# Patient Record
Sex: Female | Born: 1995
Health system: Southern US, Community
[De-identification: ages and names within clinical notes are randomized; demographics above are authoritative.]

## PROBLEM LIST (undated history)

## (undated) DIAGNOSIS — B1920 Unspecified viral hepatitis C without hepatic coma: Secondary | ICD-10-CM

## (undated) DIAGNOSIS — F32A Depression, unspecified: Secondary | ICD-10-CM

## (undated) DIAGNOSIS — R519 Headache, unspecified: Secondary | ICD-10-CM

## (undated) DIAGNOSIS — F329 Major depressive disorder, single episode, unspecified: Secondary | ICD-10-CM

## (undated) DIAGNOSIS — R51 Headache: Secondary | ICD-10-CM

---
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
## 2017-07-08 DIAGNOSIS — X58XXXA Exposure to other specified factors, initial encounter: Secondary | ICD-10-CM | POA: Diagnosis not present

## 2017-07-08 DIAGNOSIS — R1030 Lower abdominal pain, unspecified: Secondary | ICD-10-CM | POA: Diagnosis not present

## 2017-07-08 DIAGNOSIS — S0993XA Unspecified injury of face, initial encounter: Secondary | ICD-10-CM | POA: Diagnosis not present

## 2017-07-08 DIAGNOSIS — N72 Inflammatory disease of cervix uteri: Secondary | ICD-10-CM | POA: Diagnosis not present

## 2017-07-08 DIAGNOSIS — B373 Candidiasis of vulva and vagina: Secondary | ICD-10-CM | POA: Diagnosis not present

## 2017-07-08 DIAGNOSIS — N941 Unspecified dyspareunia: Secondary | ICD-10-CM | POA: Diagnosis not present

## 2017-07-08 DIAGNOSIS — S0590XA Unspecified injury of unspecified eye and orbit, initial encounter: Secondary | ICD-10-CM | POA: Diagnosis not present

## 2017-08-04 DIAGNOSIS — J039 Acute tonsillitis, unspecified: Secondary | ICD-10-CM | POA: Diagnosis not present

## 2017-08-04 DIAGNOSIS — B370 Candidal stomatitis: Secondary | ICD-10-CM | POA: Diagnosis not present

## 2017-08-06 DIAGNOSIS — Z5329 Procedure and treatment not carried out because of patient's decision for other reasons: Secondary | ICD-10-CM | POA: Diagnosis not present

## 2017-08-06 DIAGNOSIS — R21 Rash and other nonspecific skin eruption: Secondary | ICD-10-CM | POA: Diagnosis not present

## 2017-08-20 DIAGNOSIS — R070 Pain in throat: Secondary | ICD-10-CM | POA: Diagnosis not present

## 2017-08-20 DIAGNOSIS — J029 Acute pharyngitis, unspecified: Secondary | ICD-10-CM | POA: Diagnosis not present

## 2017-09-24 DIAGNOSIS — N939 Abnormal uterine and vaginal bleeding, unspecified: Secondary | ICD-10-CM | POA: Diagnosis not present

## 2017-09-24 DIAGNOSIS — R1030 Lower abdominal pain, unspecified: Secondary | ICD-10-CM | POA: Diagnosis not present

## 2017-09-24 DIAGNOSIS — L03011 Cellulitis of right finger: Secondary | ICD-10-CM | POA: Diagnosis not present

## 2017-10-06 DIAGNOSIS — F331 Major depressive disorder, recurrent, moderate: Secondary | ICD-10-CM | POA: Diagnosis not present

## 2017-10-06 DIAGNOSIS — F419 Anxiety disorder, unspecified: Secondary | ICD-10-CM | POA: Diagnosis not present

## 2017-11-17 ENCOUNTER — Encounter (HOSPITAL_COMMUNITY): Payer: Self-pay | Admitting: *Deleted

## 2017-11-17 ENCOUNTER — Emergency Department (HOSPITAL_COMMUNITY)
Admission: EM | Admit: 2017-11-17 | Discharge: 2017-11-17 | Disposition: A | Discharge status: Home / Self Care | Payer: Federal, State, Local not specified - PPO | Attending: Emergency Medicine | Admitting: Emergency Medicine

## 2017-11-17 ENCOUNTER — Emergency Department (HOSPITAL_COMMUNITY): Payer: Federal, State, Local not specified - PPO

## 2017-11-17 ENCOUNTER — Other Ambulatory Visit: Payer: Self-pay

## 2017-11-17 DIAGNOSIS — Y92410 Unspecified street and highway as the place of occurrence of the external cause: Secondary | ICD-10-CM | POA: Diagnosis not present

## 2017-11-17 DIAGNOSIS — S0993XA Unspecified injury of face, initial encounter: Secondary | ICD-10-CM | POA: Insufficient documentation

## 2017-11-17 DIAGNOSIS — S299XXA Unspecified injury of thorax, initial encounter: Secondary | ICD-10-CM | POA: Diagnosis not present

## 2017-11-17 DIAGNOSIS — Y9389 Activity, other specified: Secondary | ICD-10-CM | POA: Insufficient documentation

## 2017-11-17 DIAGNOSIS — S031XXA Dislocation of septal cartilage of nose, initial encounter: Secondary | ICD-10-CM | POA: Diagnosis not present

## 2017-11-17 DIAGNOSIS — R4182 Altered mental status, unspecified: Secondary | ICD-10-CM | POA: Diagnosis not present

## 2017-11-17 DIAGNOSIS — Z23 Encounter for immunization: Secondary | ICD-10-CM | POA: Insufficient documentation

## 2017-11-17 DIAGNOSIS — Y998 Other external cause status: Secondary | ICD-10-CM | POA: Insufficient documentation

## 2017-11-17 DIAGNOSIS — Z91040 Latex allergy status: Secondary | ICD-10-CM | POA: Diagnosis not present

## 2017-11-17 DIAGNOSIS — S199XXA Unspecified injury of neck, initial encounter: Secondary | ICD-10-CM | POA: Diagnosis not present

## 2017-11-17 DIAGNOSIS — Z79899 Other long term (current) drug therapy: Secondary | ICD-10-CM | POA: Insufficient documentation

## 2017-11-17 DIAGNOSIS — F17200 Nicotine dependence, unspecified, uncomplicated: Secondary | ICD-10-CM | POA: Diagnosis not present

## 2017-11-17 DIAGNOSIS — S0990XA Unspecified injury of head, initial encounter: Secondary | ICD-10-CM | POA: Diagnosis not present

## 2017-11-17 DIAGNOSIS — R413 Other amnesia: Secondary | ICD-10-CM | POA: Diagnosis not present

## 2017-11-17 DIAGNOSIS — S022XXA Fracture of nasal bones, initial encounter for closed fracture: Secondary | ICD-10-CM | POA: Diagnosis not present

## 2017-11-17 HISTORY — DX: Unspecified viral hepatitis C without hepatic coma: B19.20

## 2017-11-17 LAB — CBC WITH DIFFERENTIAL/PLATELET
Abs Immature Granulocytes: 0 10*3/uL (ref 0.0–0.1)
Basophils Absolute: 0 10*3/uL (ref 0.0–0.1)
Basophils Relative: 0 %
EOS PCT: 2 %
Eosinophils Absolute: 0.1 10*3/uL (ref 0.0–0.7)
HEMATOCRIT: 36.3 % (ref 36.0–46.0)
HEMOGLOBIN: 11.6 g/dL — AB (ref 12.0–15.0)
IMMATURE GRANULOCYTES: 0 %
LYMPHS ABS: 1.8 10*3/uL (ref 0.7–4.0)
LYMPHS PCT: 25 %
MCH: 29.3 pg (ref 26.0–34.0)
MCHC: 32 g/dL (ref 30.0–36.0)
MCV: 91.7 fL (ref 78.0–100.0)
MONOS PCT: 9 %
Monocytes Absolute: 0.7 10*3/uL (ref 0.1–1.0)
Neutro Abs: 4.6 10*3/uL (ref 1.7–7.7)
Neutrophils Relative %: 64 %
Platelets: 275 10*3/uL (ref 150–400)
RBC: 3.96 MIL/uL (ref 3.87–5.11)
RDW: 11.9 % (ref 11.5–15.5)
WBC: 7.3 10*3/uL (ref 4.0–10.5)

## 2017-11-17 LAB — URINALYSIS, ROUTINE W REFLEX MICROSCOPIC
BILIRUBIN URINE: NEGATIVE
GLUCOSE, UA: NEGATIVE mg/dL
KETONES UR: NEGATIVE mg/dL
LEUKOCYTES UA: NEGATIVE
NITRITE: NEGATIVE
PH: 9 — AB (ref 5.0–8.0)
Protein, ur: NEGATIVE mg/dL
RBC / HPF: >50 RBC/hpf — ABNORMAL HIGH (ref 0–5)
SPECIFIC GRAVITY, URINE: 1.009 (ref 1.005–1.030)

## 2017-11-17 LAB — COMPREHENSIVE METABOLIC PANEL
ALK PHOS: 79 U/L (ref 38–126)
ALT: 30 U/L (ref 0–44)
ANION GAP: 8 (ref 5–15)
AST: 45 U/L — ABNORMAL HIGH (ref 15–41)
Albumin: 3.8 g/dL (ref 3.5–5.0)
BILIRUBIN TOTAL: 0.7 mg/dL (ref 0.3–1.2)
BUN: 10 mg/dL (ref 6–20)
CALCIUM: 8.8 mg/dL — AB (ref 8.9–10.3)
CO2: 25 mmol/L (ref 22–32)
Chloride: 104 mmol/L (ref 98–111)
Creatinine, Ser: 0.7 mg/dL (ref 0.44–1.00)
Glucose, Bld: 94 mg/dL (ref 70–99)
POTASSIUM: 3.4 mmol/L — AB (ref 3.5–5.1)
Sodium: 137 mmol/L (ref 135–145)
TOTAL PROTEIN: 6.6 g/dL (ref 6.5–8.1)

## 2017-11-17 LAB — HCG, QUANTITATIVE, PREGNANCY

## 2017-11-17 LAB — RAPID URINE DRUG SCREEN, HOSP PERFORMED
Amphetamines: POSITIVE — AB
BENZODIAZEPINES: POSITIVE — AB
Barbiturates: NOT DETECTED
Cocaine: NOT DETECTED
OPIATES: NOT DETECTED
Tetrahydrocannabinol: NOT DETECTED

## 2017-11-17 LAB — I-STAT CG4 LACTIC ACID, ED: Lactic Acid, Venous: 0.98 mmol/L (ref 0.5–1.9)

## 2017-11-17 MED ORDER — TETANUS-DIPHTH-ACELL PERTUSSIS 5-2.5-18.5 LF-MCG/0.5 IM SUSP
0.5000 mL | Freq: Once | INTRAMUSCULAR | Status: AC
Start: 2017-11-17 — End: 2017-11-17
  Administered 2017-11-17: 0.5 mL via INTRAMUSCULAR
  Filled 2017-11-17: qty 0.5

## 2017-11-17 MED ORDER — NALOXONE HCL 0.4 MG/ML IJ SOLN
0.4000 mg | Freq: Once | INTRAMUSCULAR | Status: AC
  Administered 2017-11-17: 0.4 mg via INTRAVENOUS
  Filled 2017-11-17: qty 1

## 2017-11-17 MED ORDER — NALOXONE HCL 0.4 MG/ML IJ SOLN
0.4000 mg | Freq: Once | INTRAMUSCULAR | Status: AC
Start: 2017-11-17 — End: 2017-11-17
  Administered 2017-11-17: 0.4 mg via INTRAVENOUS
  Filled 2017-11-17: qty 1

## 2017-11-17 MED ORDER — ONDANSETRON HCL 4 MG/2ML IJ SOLN
4.0000 mg | Freq: Once | INTRAMUSCULAR | Status: DC
  Filled 2017-11-17: qty 2

## 2017-11-17 MED ORDER — MORPHINE SULFATE 15 MG PO TABS
15.0000 mg | ORAL_TABLET | Freq: Once | ORAL | Status: AC
  Administered 2017-11-17: 15 mg via ORAL
  Filled 2017-11-17: qty 1

## 2017-11-17 MED ORDER — CEPHALEXIN 500 MG PO CAPS: 500.0000 mg | ORAL_CAPSULE | Freq: Four times a day (QID) | ORAL | 0 refills | Status: AC

## 2017-11-17 MED ORDER — OXYMETAZOLINE HCL 0.05 % NA SOLN: 1.0000 | Freq: Once | NASAL | Status: DC

## 2017-11-17 MED ORDER — CEFAZOLIN SODIUM-DEXTROSE 2-4 GM/100ML-% IV SOLN
2.0000 g | Freq: Once | INTRAVENOUS | Status: AC
  Administered 2017-11-17: 2 g via INTRAVENOUS
  Filled 2017-11-17: qty 100
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
## 2017-11-17 NOTE — ED Notes (Signed)
More alert after narcan states she took suboxone this am.

## 2017-11-17 NOTE — ED Notes (Signed)
Ice pack applied  To nose

## 2017-11-17 NOTE — ED Notes (Signed)
Patient transported to CT 
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
## 2017-11-17 NOTE — ED Notes (Signed)
Patient is very agitated  Wanting to leave and go get her stuff out of her car,  States she is going to leave ,mother is very upset , Cammy Copabigail is at bedside. Explaining to patient the importance of staying

## 2017-11-17 NOTE — Discharge Instructions (Signed)
Heather CollinBreyanna Gilreath:  Thank you for allowing us to take care of you today.  We hope you begin feeling better soon.  To-Do: Please follow-up with ENT in one week Take Keflex for your antibiotic Take tylenol and motrin for your pain Please return to the Emergency Department or call 911 if you experience chest pain, shortness of breath, severe pain, severe fever, altered mental status, or have any reason to think that you need emergency medical care.  Thank you again.  Hope you feel better soon.

## 2017-11-17 NOTE — ED Notes (Signed)
Patient is very sleepy , very hard to arouse, Harris PA at bedside. Narcan given.
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
## 2017-11-17 NOTE — ED Notes (Signed)
Mother at bedside. Patient is alert talking to mother ,

## 2017-11-23 DIAGNOSIS — S022XXD Fracture of nasal bones, subsequent encounter for fracture with routine healing: Secondary | ICD-10-CM | POA: Diagnosis not present

## 2017-11-23 DIAGNOSIS — T171XXD Foreign body in nostril, subsequent encounter: Secondary | ICD-10-CM | POA: Diagnosis not present

## 2017-11-26 DIAGNOSIS — X58XXXD Exposure to other specified factors, subsequent encounter: Secondary | ICD-10-CM | POA: Diagnosis not present

## 2017-11-26 DIAGNOSIS — J342 Deviated nasal septum: Secondary | ICD-10-CM | POA: Diagnosis not present

## 2017-11-26 DIAGNOSIS — S022XXA Fracture of nasal bones, initial encounter for closed fracture: Secondary | ICD-10-CM | POA: Diagnosis not present

## 2017-11-26 DIAGNOSIS — Y999 Unspecified external cause status: Secondary | ICD-10-CM | POA: Diagnosis not present

## 2017-11-26 DIAGNOSIS — S022XXD Fracture of nasal bones, subsequent encounter for fracture with routine healing: Secondary | ICD-10-CM | POA: Diagnosis not present

## 2017-11-26 DIAGNOSIS — S0035XA Superficial foreign body of nose, initial encounter: Secondary | ICD-10-CM | POA: Diagnosis not present

## 2017-12-06 HISTORY — PX: NOSE SURGERY: SHX723

## 2017-12-27 DIAGNOSIS — B9689 Other specified bacterial agents as the cause of diseases classified elsewhere: Secondary | ICD-10-CM | POA: Diagnosis not present

## 2017-12-27 DIAGNOSIS — L02415 Cutaneous abscess of right lower limb: Secondary | ICD-10-CM | POA: Diagnosis not present

## 2017-12-27 DIAGNOSIS — R52 Pain, unspecified: Secondary | ICD-10-CM | POA: Diagnosis not present

## 2018-04-20 DIAGNOSIS — R234 Changes in skin texture: Secondary | ICD-10-CM | POA: Diagnosis not present

## 2018-04-20 DIAGNOSIS — M79601 Pain in right arm: Secondary | ICD-10-CM | POA: Diagnosis not present

## 2018-04-20 DIAGNOSIS — F419 Anxiety disorder, unspecified: Secondary | ICD-10-CM | POA: Diagnosis not present

## 2018-05-13 DIAGNOSIS — N760 Acute vaginitis: Secondary | ICD-10-CM | POA: Diagnosis not present

## 2018-05-13 DIAGNOSIS — Z202 Contact with and (suspected) exposure to infections with a predominantly sexual mode of transmission: Secondary | ICD-10-CM | POA: Diagnosis not present

## 2018-05-13 DIAGNOSIS — Z760 Encounter for issue of repeat prescription: Secondary | ICD-10-CM | POA: Diagnosis not present

## 2018-05-17 DIAGNOSIS — R5382 Chronic fatigue, unspecified: Secondary | ICD-10-CM | POA: Diagnosis not present

## 2018-05-31 DIAGNOSIS — F419 Anxiety disorder, unspecified: Secondary | ICD-10-CM | POA: Diagnosis not present

## 2018-05-31 DIAGNOSIS — N760 Acute vaginitis: Secondary | ICD-10-CM | POA: Diagnosis not present

## 2018-05-31 DIAGNOSIS — F411 Generalized anxiety disorder: Secondary | ICD-10-CM | POA: Diagnosis not present

## 2018-05-31 DIAGNOSIS — N771 Vaginitis, vulvitis and vulvovaginitis in diseases classified elsewhere: Secondary | ICD-10-CM | POA: Diagnosis not present

## 2018-05-31 DIAGNOSIS — B182 Chronic viral hepatitis C: Secondary | ICD-10-CM | POA: Diagnosis not present

## 2018-07-09 ENCOUNTER — Encounter: Payer: Federal, State, Local not specified - PPO | Admitting: Infectious Diseases

## 2018-07-23 DIAGNOSIS — L03317 Cellulitis of buttock: Secondary | ICD-10-CM | POA: Diagnosis not present

## 2018-07-25 DIAGNOSIS — L0231 Cutaneous abscess of buttock: Secondary | ICD-10-CM | POA: Diagnosis not present

## 2018-07-25 DIAGNOSIS — F17210 Nicotine dependence, cigarettes, uncomplicated: Secondary | ICD-10-CM | POA: Diagnosis not present

## 2018-07-25 DIAGNOSIS — B9689 Other specified bacterial agents as the cause of diseases classified elsewhere: Secondary | ICD-10-CM | POA: Diagnosis not present

## 2018-08-24 DIAGNOSIS — L03115 Cellulitis of right lower limb: Secondary | ICD-10-CM | POA: Diagnosis not present

## 2018-08-24 DIAGNOSIS — Z716 Tobacco abuse counseling: Secondary | ICD-10-CM | POA: Diagnosis not present

## 2018-08-27 DIAGNOSIS — L0201 Cutaneous abscess of face: Secondary | ICD-10-CM | POA: Diagnosis not present

## 2018-08-27 DIAGNOSIS — L03211 Cellulitis of face: Secondary | ICD-10-CM | POA: Diagnosis not present

## 2018-08-27 DIAGNOSIS — B9689 Other specified bacterial agents as the cause of diseases classified elsewhere: Secondary | ICD-10-CM | POA: Diagnosis not present

## 2018-08-27 DIAGNOSIS — L02416 Cutaneous abscess of left lower limb: Secondary | ICD-10-CM | POA: Diagnosis not present

## 2018-08-27 DIAGNOSIS — Z8619 Personal history of other infectious and parasitic diseases: Secondary | ICD-10-CM | POA: Diagnosis not present

## 2018-08-27 DIAGNOSIS — F17210 Nicotine dependence, cigarettes, uncomplicated: Secondary | ICD-10-CM | POA: Diagnosis not present

## 2018-08-31 DIAGNOSIS — F1120 Opioid dependence, uncomplicated: Secondary | ICD-10-CM | POA: Diagnosis not present

## 2018-08-31 DIAGNOSIS — Z79899 Other long term (current) drug therapy: Secondary | ICD-10-CM | POA: Diagnosis not present

## 2018-08-31 DIAGNOSIS — F1010 Alcohol abuse, uncomplicated: Secondary | ICD-10-CM | POA: Diagnosis not present

## 2018-08-31 DIAGNOSIS — F1510 Other stimulant abuse, uncomplicated: Secondary | ICD-10-CM | POA: Diagnosis not present

## 2018-08-31 DIAGNOSIS — F332 Major depressive disorder, recurrent severe without psychotic features: Secondary | ICD-10-CM | POA: Diagnosis not present

## 2018-08-31 DIAGNOSIS — N3000 Acute cystitis without hematuria: Secondary | ICD-10-CM | POA: Diagnosis not present

## 2018-08-31 DIAGNOSIS — G2581 Restless legs syndrome: Secondary | ICD-10-CM | POA: Diagnosis not present

## 2018-08-31 DIAGNOSIS — F17210 Nicotine dependence, cigarettes, uncomplicated: Secondary | ICD-10-CM | POA: Diagnosis not present

## 2018-08-31 DIAGNOSIS — F1320 Sedative, hypnotic or anxiolytic dependence, uncomplicated: Secondary | ICD-10-CM | POA: Diagnosis not present

## 2018-08-31 DIAGNOSIS — F4310 Post-traumatic stress disorder, unspecified: Secondary | ICD-10-CM | POA: Diagnosis not present

## 2018-08-31 DIAGNOSIS — L0390 Cellulitis, unspecified: Secondary | ICD-10-CM | POA: Diagnosis not present

## 2018-09-01 DIAGNOSIS — S199XXA Unspecified injury of neck, initial encounter: Secondary | ICD-10-CM | POA: Diagnosis not present

## 2018-09-07 ENCOUNTER — Telehealth: Payer: Self-pay | Admitting: *Deleted

## 2018-09-07 ENCOUNTER — Other Ambulatory Visit: Payer: Self-pay

## 2018-09-07 ENCOUNTER — Encounter: Payer: Self-pay | Admitting: Infectious Diseases

## 2018-09-07 ENCOUNTER — Ambulatory Visit (INDEPENDENT_AMBULATORY_CARE_PROVIDER_SITE_OTHER): Payer: Federal, State, Local not specified - PPO | Admitting: Infectious Diseases

## 2018-09-07 VITALS — BP 103/66 | HR 80 | Temp 98.0°F | Ht 64.0 in | Wt 115.0 lb

## 2018-09-07 DIAGNOSIS — B182 Chronic viral hepatitis C: Secondary | ICD-10-CM | POA: Diagnosis not present

## 2018-09-07 DIAGNOSIS — R768 Other specified abnormal immunological findings in serum: Secondary | ICD-10-CM

## 2018-09-07 DIAGNOSIS — F1990 Other psychoactive substance use, unspecified, uncomplicated: Secondary | ICD-10-CM

## 2018-09-07 DIAGNOSIS — F1121 Opioid dependence, in remission: Secondary | ICD-10-CM

## 2018-09-07 DIAGNOSIS — F332 Major depressive disorder, recurrent severe without psychotic features: Secondary | ICD-10-CM

## 2018-09-07 MED ORDER — DOLACET PO
0.10 | ORAL | Status: DC
Start: ? — End: 2018-09-07

## 2018-09-07 MED ORDER — NUTREN 1.0 PO
1.00 | ORAL | Status: DC
Start: ? — End: 2018-09-07

## 2018-09-07 MED ORDER — PYRILAMINE TAN-PHENYLEPH TAN 30-5 MG/5ML PO SUSP
50.00 | ORAL | Status: DC
Start: ? — End: 2018-09-07

## 2018-09-07 MED ORDER — PYRIDOXINE-KELP-LECT-VINEGAR 20-33-400-80 MG PO TABS
ORAL_TABLET | ORAL | Status: DC
Start: 2018-09-05 — End: 2018-09-07

## 2018-09-07 MED ORDER — RA GLUCOSAMINE-CHONDROITIN DS 500-400 MG PO TABS
100.00 | ORAL_TABLET | ORAL | Status: DC
Start: 2018-09-05 — End: 2018-09-07

## 2018-09-07 MED ORDER — GENERIC EXTERNAL MEDICATION
1.00 | Status: DC
Start: 2018-09-06 — End: 2018-09-07

## 2018-09-07 MED ORDER — QUINERVA 260 MG PO TABS
650.00 | ORAL_TABLET | ORAL | Status: DC
Start: ? — End: 2018-09-07

## 2018-09-07 MED ORDER — SLO-NIACIN 500 MG PO TBCR
10.00 | EXTENDED_RELEASE_TABLET | ORAL | Status: DC
Start: ? — End: 2018-09-07

## 2018-09-07 MED ORDER — GENAPAP 325 MG PO TABS
2.00 | ORAL_TABLET | ORAL | Status: DC
Start: ? — End: 2018-09-07

## 2018-09-07 MED ORDER — MP TRI-FED COLD 2.5-60 MG PO TABS
50.00 | ORAL_TABLET | ORAL | Status: DC
Start: ? — End: 2018-09-07

## 2018-09-07 MED ORDER — PEDIASURE 1.0 CAL/FIBER PO LIQD
2.00 | ORAL | Status: DC
Start: ? — End: 2018-09-07

## 2018-09-07 MED ORDER — Medication
10.00 | Status: DC
Start: 2018-09-06 — End: 2018-09-07

## 2018-09-07 MED ORDER — SB BRONCHIAL 12.5-200 MG PO TABS
50.00 | ORAL_TABLET | ORAL | Status: DC
Start: ? — End: 2018-09-07

## 2018-09-07 MED ORDER — CVS CALCIUM CARBONATE PO
400.00 | ORAL | Status: DC
Start: ? — End: 2018-09-07

## 2018-09-07 MED ORDER — PIPERACILLIN SODIUM
10.00 | Status: DC
Start: ? — End: 2018-09-07

## 2018-09-07 MED ORDER — DIPHENDRYL 25 MG PO TABS
1.00 | ORAL_TABLET | ORAL | Status: DC
Start: 2018-09-06 — End: 2018-09-07

## 2018-09-07 MED ORDER — BENZOCAINE-MENTHOL 6-10 MG MT LOZG
1.00 | LOZENGE | OROMUCOSAL | Status: DC
Start: ? — End: 2018-09-07

## 2018-09-07 MED ORDER — PROMETHAZINE HCL (BULK CHEMICALS - P'S)
50.00 | Status: DC
Start: ? — End: 2018-09-07

## 2018-09-07 MED ORDER — HALOPERIDOL 5 MG PO TABS
5.00 | ORAL_TABLET | ORAL | Status: DC
Start: ? — End: 2018-09-07

## 2018-09-07 MED ORDER — IPOL IJ INJ
25.00 | INJECTION | INTRAMUSCULAR | Status: DC
Start: ? — End: 2018-09-07

## 2018-09-07 MED ORDER — POLY-VITAMIN/FLUORIDE/IRON 1-12 MG PO CHEW
1.00 | CHEWABLE_TABLET | ORAL | Status: DC
Start: 2018-09-05 — End: 2018-09-07

## 2018-09-07 MED ORDER — Medication
30.00 | Status: DC
Start: ? — End: 2018-09-07

## 2018-09-07 MED ORDER — Medication
5.00 | Status: DC
Start: ? — End: 2018-09-07

## 2018-09-07 MED ORDER — BL TUSSIN CF 30-10-100 MG/5ML PO SYRP
2.00 | ORAL_SOLUTION | ORAL | Status: DC
Start: ? — End: 2018-09-07

## 2018-09-07 MED ORDER — NEOMYCIN-POLYMYXIN-GRAMICIDIN 1.75-10000-.025 OP SOLN
8.00 | OPHTHALMIC | Status: DC
Start: ? — End: 2018-09-07

## 2018-09-07 MED ORDER — BELLADONNA ALKALOIDS-OPIUM RE
10.00 | RECTAL | Status: DC
Start: ? — End: 2018-09-07

## 2018-09-07 NOTE — Telephone Encounter (Signed)
Patient establishing care for treatment of Hepatitis C. She endorses some depression with anxiety, has recently finished drug treatment in [facility], is in weekly counseling with a pastor at her sister's church, has been to [facility] once. She would appreciate any counseling services available at [facility] as well, Will 'cc [facility]. Andree Coss, RN

## 2018-09-07 NOTE — Telephone Encounter (Signed)
Wonderful - thank you so much for helping refer her to [facility]. She is still at a vulnerable point in her rehab and treatment.

## 2018-09-07 NOTE — Progress Notes (Signed)
Patient Name: Heather Carney  Date of Birth: [birth year]  MRN: [id number]  PCP: Dema Severin, NP  Referring Provider: Dema Severin, NP, Ph#: [phone]   Patient Active Problem List   Diagnosis Date Noted  . Opioid use disorder, severe, in early remission, in controlled environment (HCC) 09/08/2018  . Injection of illicit drug within last 12 months 09/08/2018  . Positive hepatitis C antibody test 09/08/2018  . MDD (major depressive disorder), recurrent severe, without psychosis (HCC) 07/15/2014  . PTSD (post-traumatic stress disorder) 07/12/2014    CC:  New patient - initial evaluation and management of chronic hepatitis C infection.   HPI/ROS:  Heather Carney is a 23 y.o. female is a 23 y.o. female.   She is here today for consideration of treatment of chronic hepatitis C infection.  She first learned of this infection a few years back during a attempted detox.  Due to her mother's encouragement a few years back she was preparing to do treatment for hepatitis however she was not ready and did not decide to start medications at the time.  She recently completed a detox program and now in a supervised and supportive community setting and ready to accept treatment.  Hepatitis C risk factors include injection drug use x7 years with most recent use within the last 12 months.  She is not sure if she is ever been tested for HIV.  She is not currently sexually active but when she is using strict condom use each time.  Treated in January of this year for chlamydia per referral records.  She has a strong family history of drug use and mental health disorders.  She struggles with depression and anxiety and is currently taking treatment for this with gabapentin and Lexapro.  Reports moderate effect.  She denies suicide/homicidal intent or planning.  She smokes about a pack of cigarettes a day.  She does not drink any alcohol.  Major physical concerns coming in today include fatigue ("spends most  of the days asleep"), joint pain, multiple abscesses in various locations.  She reports having had all vaccinations as a child per recommendations.  Constitutional: negative for fevers, sweats, anorexia and weight loss; positive for fatigue/malaise Eyes: negative for icterus Cardiovascular: negative for dyspnea, lower extremity edema Gastrointestinal: negative for nausea, vomiting, constipation, abdominal pain and jaundice Hematologic/lymphatic: negative for easy bruising and petechiae Musculoskeletal: positive for arthralgias, negative for muscle weakness Neurological: negative for dizziness, paresthesia, gait problems and tremor Behavioral/Psych: positive for depression, anxiety; negative for current drug use, suicide intentions All other systems reviewed and are negative      Past Medical History:  Diagnosis Date  . Depression   . Headache   . Hepatitis C     Prior to Admission medications   Medication Sig Start Date End Date Taking? Authorizing Provider  acetaminophen (TYLENOL) 500 MG tablet Take 1,000 mg by mouth every 6 (six) hours as needed for mild pain.    [provider]  citalopram (CELEXA) 10 MG tablet Take 3 tablets (30 mg total) by mouth daily at 6 PM. Patient not taking: Reported on 11/17/2017 07/24/14   Withrow, Everardo All, FNP  ibuprofen (ADVIL,MOTRIN) 400 MG tablet Take 400 mg by mouth every 6 (six) hours as needed for headache or mild pain.    [provider]  Norgestimate-Ethinyl Estradiol Triphasic 0.18/0.215/0.25 MG-35 MCG tablet Take 1 tablet by mouth as directed. TO BE MANAGED BY PRESCRIBING PROVIDER OUTPATIENT Patient not taking: Reported on 11/17/2017 07/24/14  Withrow, Everardo AllJohn C, FNP    Allergies  Allergen Reactions  . Latex Itching and Rash    Social History   Tobacco Use  . Smoking status: Current Some Day Smoker  . Smokeless tobacco: Never Used  Substance Use Topics  . Alcohol use: Not Currently  . Drug use: Not Currently    Types:  Heroin    Family History  Problem Relation Age of Onset  . Mental illness Other   . Suicidality Other   . Drug abuse Mother   . Hypertension Mother   . Drug abuse Father   . Alcohol abuse Father   . Liver cancer Neg Hx   . Cirrhosis Neg Hx     Objective:   Vitals:   09/07/18 1437  BP: 103/66  Pulse: 80  Temp: 98 F (36.7 C)   Constitutional: in no apparent distress, well developed and well nourished, oriented times 3 and anicteric. Seated comfortably in chair. Nervous.  Eyes: anicteric Cardiovascular: Cor RRR and No murmurs Respiratory: clear Gastrointestinal: Bowel sounds are normal, liver is not enlarged, spleen is not enlarged Musculoskeletal: peripheral pulses normal, no pedal edema, no clubbing or cyanosis Skin: negative for - jaundice, spider hemangioma, telangiectasia, palmar erythema, ecchymosis and atrophy; no porphyria cutanea tarda; healing lesion from previous abscess to right knee noted with purple discoloration.  Lymphatic: no cervical lymphadenopathy   Laboratory: Genotype: No results found for: HCVGENOTYPE HCV viral load: No results found for: HCVQUANT Lab Results  Component Value Date   WBC 7.2 09/07/2018   HGB 12.0 09/07/2018   HCT 37.0 09/07/2018   MCV 86.0 09/07/2018   PLT 523 (H) 09/07/2018    Lab Results  Component Value Date   CREATININE 0.70 11/17/2017   BUN 10 11/17/2017   NA 137 11/17/2017   K 3.4 (L) 11/17/2017   CL 104 11/17/2017   CO2 25 11/17/2017    Lab Results  Component Value Date   ALT 42 (H) 09/07/2018   AST 46 (H) 09/07/2018   ALKPHOS 79 11/17/2017    Lab Results  Component Value Date   INR 0.9 09/07/2018   BILITOT 0.2 09/07/2018   ALBUMIN 3.8 11/17/2017   Imaging:  none  Assessment & Plan:   Problem List Items Addressed This Visit      Unprioritized   Injection of illicit drug within last 12 months    Completed intensive detox program and currently in early stages of remission.  She is highly motivated to  continue this.  I congratulated her on her efforts in hopes that she keeps going.      MDD (major depressive disorder), recurrent severe, without psychosis (HCC)    Currently treated on Lexapro.  We offered and discussed referral to Rene KocherRegina one of our mental health counselors we can offer during treatment with her hepatitis C.  She was very accepting of this and appreciative of another opportunity to help her.      Relevant Medications   escitalopram (LEXAPRO) 10 MG tablet   Opioid use disorder, severe, in early remission, in controlled environment [facility](HCC)    As above congratulated on her sobriety.  Not currently maintained on any replacement therapy at this time.      Positive hepatitis C antibody test    New Patient with history of a positive hepatitis C antibody blood test per chart records and personal history.  She is treatment nave.  No genotype, RNA, liver function tests are available for reference.  We will repeat hepatitis  C RNA today to establish chronic infection, as well as liver function tests, CBC, INR, fibro-test to assist with noninvasive liver staging in preparation to start her treatment.  I discussed with the patient the lab findings that confirm chronic hepatitis C as well as the natural history and progression of disease including about 30% of people who develop cirrhosis of the liver if left untreated and once cirrhosis is established there is a 2-7% risk per year of liver cancer and liver failure.  I discussed the importance of treatment and benefits in reducing the risk, even if significant liver fibrosis exists. I also discussed risk for re-infection following treatment should he not continue to modify risk factors.    Patient counseled extensively on limiting acetaminophen to no more than 2 grams daily, avoidance of alcohol.  Transmission discussed with patient including sexual transmission, sharing razors and toothbrush.   Will need referral to gastroenterology if  concern for cirrhosis, however given her age and history this is unlikely to be the case.  We have referred her to 1 of our mental health counselors today.  She currently is in a supportive environment to facilitate early remission of her severe opioid dependency.  I will likely prescribe Mavyret however if genotype/insurance dictates otherwise we will reevaluate.  Hepatitis A and B titers to be drawn today with appropriate vaccinations as needed   Pneumovax vaccine at upcoming visit -she passes out with needle sticks and we decided to proceed only with phlebotomy today with vaccination shortly thereafter.  Further work up to include liver staging through non-invasive serum analysis with APRI and FIB4 scores and Liver Fibrosis panel; U/S to follow if discordant or concerning results.  Will call Ellis Koffler back once all results are in and counsel on medication over the phone.  She will return 4 weeks after starting to meet with pharmacy team and check RNA at that time.         Other Visit Diagnoses    Chronic hepatitis C without hepatic coma (HCC)    -  Primary   Relevant Orders   Hepatitis C RNA quantitative ([facility])   Hepatitis B surface antigen (Completed)   Hepatitis B surface antibody,qualitative (Completed)   HIV Antibody (routine testing w rflx) (Completed)   hCG, quantitative, pregnancy (Completed)   Hepatitis C genotype   Hepatic function panel (Completed)   CBC (Completed)   Liver Fibrosis, FibroTest-ActiTest   Protime-INR (Completed)      I spent 45 minutes with the patient including greater than 70% of time in face to face counsel of the patient re hepatitis c and the details described above and in coordination of their care.  She also spent time with her pharmacy/patient advocacy team in preparation of accessing her hepatitis C medications.  Rexene Alberts, MSN, NP-C [facility] for Infectious Disease [facility]  [email or web address]  Pager: [phone] Office: [phone] [facility] Main Line: [phone]

## 2018-09-07 NOTE — Patient Instructions (Signed)
Nice to meet you today!    We need to get a little more information about your hepatitis c infection before we start your treatment. I anticipate that we can get you started in a few weeks after we submit approval to your insurance to ensure payment. We may need to place referral for an ultrasound and/or gastroenterology if your blood work indicates more damage to the liver than expected.     ABOUT HEPATITIS C VIRUS:   Chronic Hepatitis C is the leading cause of cirrhosis, liver cancer, and end stage liver disease requiring transplantation when this infection goes untreated for many years  There are not many specific symptoms of early infection and often goes unrecognized until specific blood test is ordered  The hepatitis c virus is passed primarily through direct exposure of contaminated blood or body fluids -   Risk for sexual transmission is very low but is possible if there is high frequency of unprotected sexual activity with known hepatitis c partner or multiple partners of known status.  Advanced cirrhosis occurs in approximately 10-20% of those with chronic infection.  Approximately 15-25% clear spontaneously (usually in the first 6 months of becoming exposed to virus)  Newer medications provide over 90% cure rate when taken as prescribed  IN GENERAL ABOUT DIET  . Persons living with chronic hepatitis c infection should have a balanced diet and choose nutritious foods from each major food group to maintain a healthy weight and avoid nutritional deficiencies.  .  . Patients with cirrhosis should not have protein restriction; we recommend a protein intake of approximately 1.2-1.5 g/kg/day.  . For patients with cirrhosis and hepatic encephalopathy, the American Association for the Study of Liver Diseases (AASLD) recommended protein intake is 1.2-1.5 g/kg/day.[82] . If you experience ascites please limit sodium intake to < 2000 mg a day   UNTIL YOU HAVE BEEN TREATED AND  CURED:  . Use condoms with sexual encounters or abstinence . No sharing of razors, toothbrushes, nail clippers or anything that could potentially have blood on it.  . If you cut yourself and blood spills onto item/surface please clean with 1:10 bleach solution and allow to dry, EVEN if it is dried blood.  . Limit alcohol to as little as possible to less than 1 drink a day - this is very irritating to your liver. . Limit tylenol use to less than 2,000 mg daily (two extra strength tablets only twice a day) . If you have cirrhosis of the liver please take no more than 1,000 mg tylenol a day   GENERAL HELPFUL HINTS ON HCV THERAPY: 1. Stay well-hydrated. 2. Notify the [facility] Clinic of any changes in your other over-the-counter/herbal or prescription medications. 3. If you miss a dose of your medication, take the missed dose as soon as you remember. Return to your regular time/dose schedule the next day.  4.  Do not stop taking your medications without first talking with your healthcare provider. 5.  You will see our pharmacist-specialist within the first 2 weeks of starting your medication to monitor for any possible side effects. 6.  You will have blood work once during treatment 4 weeks after your first pill. Again soon after treatment is completed and one final lab 3 months after your last pill to ensure cure!   TIPS TO BE SUCCESSFUL WITH DAILY MEDICATION USE: 1. Set a reminder on your phone  2. Try filling out a pill box for the week - pick a day and put one  pill for every day during the week so you know right away if you missed a pill.  3. Have a trusted family member ask you about your medications.  4. Smartphone app    Medication we would like to use for you will be : MAVYRET  Mavyret Instructions:  1. Take Mavyret, three tablets (at the same time) daily with food. Please take ALL THREE PILLS AT ONCE. You should take it at approximately the same time every day. Treatment will be for 8  weeks. Do not miss a dose.    2. Do not run out of Mavyret! If you are down to one week of medication left and have not heard about your next shipment, please let us know as soon as possible. You will be given 28 days of treatment at a time and will receive one refill.   3. If you need to start a new medication, prescription from your doctor or over the counter medication, you need to contact us to make sure it does not interfere with Mavyret. There are several medications that can interfere with Mavyret and can make you sick or make the medication not work.  4. If you need to take a medication for acid reflux, you can take omeprazole 20mg daily.     5. Tylenol (acetominophen) and Advil (ibuprofen) are safe to take with Harvoni if needed for headache, fever, pain.   6. IF YOU ARE ON BIRTH CONTROL PILLS, YOU RECEIVE A SHOT FOR BIRTH CONTROL, OR YOU HAVE AN IUD, please notify your provider to make sure it is safe with Mavyret.   7. DO NOT stop Mavyret unless instructed to by your provider. If you are hospitalized while taking this medication please bring it with you to the hospital to avoid interruption of therapy. Every pill is important!  8. The most common side effects associated with Mavyret include:  o Fatigue o Headache o Nausea o Diarrhea o Insomnia     

## 2018-09-08 DIAGNOSIS — F1121 Opioid dependence, in remission: Secondary | ICD-10-CM | POA: Insufficient documentation

## 2018-09-08 DIAGNOSIS — F1990 Other psychoactive substance use, unspecified, uncomplicated: Secondary | ICD-10-CM | POA: Insufficient documentation

## 2018-09-08 DIAGNOSIS — R768 Other specified abnormal immunological findings in serum: Secondary | ICD-10-CM | POA: Insufficient documentation

## 2018-09-08 NOTE — Assessment & Plan Note (Addendum)
As above congratulated on her sobriety.  Not currently maintained on any replacement therapy at this time.

## 2018-09-08 NOTE — Assessment & Plan Note (Signed)
New Patient with history of a positive hepatitis C antibody blood test per chart records and personal history.  She is treatment nave.  No genotype, RNA, liver function tests are available for reference.  We will repeat hepatitis C RNA today to establish chronic infection, as well as liver function tests, CBC, INR, fibro-test to assist with noninvasive liver staging in preparation to start her treatment.  I discussed with the patient the lab findings that confirm chronic hepatitis C as well as the natural history and progression of disease including about 30% of people who develop cirrhosis of the liver if left untreated and once cirrhosis is established there is a 2-7% risk per year of liver cancer and liver failure.  I discussed the importance of treatment and benefits in reducing the risk, even if significant liver fibrosis exists. I also discussed risk for re-infection following treatment should he not continue to modify risk factors.    Patient counseled extensively on limiting acetaminophen to no more than 2 grams daily, avoidance of alcohol.  Transmission discussed with patient including sexual transmission, sharing razors and toothbrush.   Will need referral to gastroenterology if concern for cirrhosis, however given her age and history this is unlikely to be the case.  We have referred her to 1 of our mental health counselors today.  She currently is in a supportive environment to facilitate early remission of her severe opioid dependency.  I will likely prescribe Mavyret however if genotype/insurance dictates otherwise we will reevaluate.  Hepatitis A and B titers to be drawn today with appropriate vaccinations as needed   Pneumovax vaccine at upcoming visit -she passes out with needle sticks and we decided to proceed only with phlebotomy today with vaccination shortly thereafter.  Further work up to include liver staging through non-invasive serum analysis with APRI and FIB4 scores and  Liver Fibrosis panel; U/S to follow if discordant or concerning results.  Will call Jettie Ferrence back once all results are in and counsel on medication over the phone.  She will return 4 weeks after starting to meet with pharmacy team and check RNA at that time.

## 2018-09-08 NOTE — Assessment & Plan Note (Signed)
Currently treated on Lexapro.  We offered and discussed referral to Rene Kocher one of our mental health counselors we can offer during treatment with her hepatitis C.  She was very accepting of this and appreciative of another opportunity to help her.

## 2018-09-08 NOTE — Assessment & Plan Note (Signed)
Completed intensive detox program and currently in early stages of remission.  She is highly motivated to continue this.  I congratulated her on her efforts in hopes that she keeps going.

## 2018-09-08 NOTE — Progress Notes (Signed)
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 Pregnancy test negative.  Will await Fibrotest result to confirm then call the patient and notify pharmacy team to proceed with Mavyret PA.

## 2018-09-13 NOTE — Telephone Encounter (Signed)
Attempted 2x last week to contact [facility] for appointment scheduling with Rollene Fare. Patient not near the phone, not available per her family.  RN did not request call back as I did not have clearance to speak with anyone else in her household.

## 2018-09-13 NOTE — Telephone Encounter (Signed)
Thank you for trying, hopefully she will call back.

## 2018-09-20 ENCOUNTER — Telehealth: Payer: Self-pay | Admitting: Pharmacy Technician

## 2018-09-20 ENCOUNTER — Other Ambulatory Visit: Payer: Self-pay | Admitting: Pharmacist

## 2018-09-20 ENCOUNTER — Telehealth: Payer: Self-pay | Admitting: Infectious Diseases

## 2018-09-20 DIAGNOSIS — B182 Chronic viral hepatitis C: Secondary | ICD-10-CM

## 2018-09-20 LAB — LIVER FIBROSIS, FIBROTEST-ACTITEST
ALT: 40 U/L — ABNORMAL HIGH (ref 6–29)
Alpha-2-Macroglobulin: 286 mg/dL — ABNORMAL HIGH (ref 106–279)
Apolipoprotein A1: 173 mg/dL (ref 101–198)
Bilirubin: 0.2 mg/dL (ref 0.2–1.2)
Fibrosis Score: 0.03
GGT: 14 U/L (ref 3–40)
Haptoglobin: 255 mg/dL — ABNORMAL HIGH (ref 43–212)
Necroinflammat ACT Score: 0.16
Reference ID: 2965789

## 2018-09-20 LAB — HEPATIC FUNCTION PANEL
AG Ratio: 1.2 (calc) (ref 1.0–2.5)
ALT: 42 U/L — ABNORMAL HIGH (ref 6–29)
AST: 46 U/L — ABNORMAL HIGH (ref 10–30)
Albumin: 4.2 g/dL (ref 3.6–5.1)
Alkaline phosphatase (APISO): 116 U/L (ref 31–125)
Bilirubin, Direct: 0.1 mg/dL (ref 0.0–0.2)
Globulin: 3.6 g/dL (calc) (ref 1.9–3.7)
Indirect Bilirubin: 0.1 mg/dL (calc) — ABNORMAL LOW (ref 0.2–1.2)
Total Bilirubin: 0.2 mg/dL (ref 0.2–1.2)
Total Protein: 7.8 g/dL (ref 6.1–8.1)

## 2018-09-20 LAB — HIV ANTIBODY (ROUTINE TESTING W REFLEX): HIV 1&2 Ab, 4th Generation: NONREACTIVE

## 2018-09-20 LAB — HEPATITIS C GENOTYPE

## 2018-09-20 LAB — CBC
HCT: 37 % (ref 35.0–45.0)
Hemoglobin: 12 g/dL (ref 11.7–15.5)
MCH: 27.9 pg (ref 27.0–33.0)
MCHC: 32.4 g/dL (ref 32.0–36.0)
MCV: 86 fL (ref 80.0–100.0)
MPV: 9.2 fL (ref 7.5–12.5)
Platelets: 523 10*3/uL — ABNORMAL HIGH (ref 140–400)
RBC: 4.3 10*6/uL (ref 3.80–5.10)
RDW: 12.2 % (ref 11.0–15.0)
WBC: 7.2 10*3/uL (ref 3.8–10.8)

## 2018-09-20 LAB — PROTIME-INR
INR: 0.9
Prothrombin Time: 9.9 s (ref 9.0–11.5)

## 2018-09-20 LAB — HEPATITIS B SURFACE ANTIBODY,QUALITATIVE: Hep B S Ab: NONREACTIVE

## 2018-09-20 LAB — HEPATITIS C RNA QUANTITATIVE
HCV Quantitative Log: 6.26 Log IU/mL — ABNORMAL HIGH
HCV RNA, PCR, QN: 1820000 IU/mL — ABNORMAL HIGH

## 2018-09-20 LAB — HCG, QUANTITATIVE, PREGNANCY: HCG, Total, QN: <3 m[IU]/mL

## 2018-09-20 LAB — HEPATITIS B SURFACE ANTIGEN: Hepatitis B Surface Ag: NONREACTIVE

## 2018-09-20 MED ORDER — LEDIPASVIR-SOFOSBUVIR 90-400 MG PO TABS: 1.0000 | ORAL_TABLET | Freq: Every day | ORAL | 1 refills | Status: DC

## 2018-09-20 NOTE — Telephone Encounter (Signed)
Patient called on listed number. Female answered. I requested to have her give her doctor's office a call back for test results. Non specific and non-identifying information and did not introduce myself.   She has no significant findings of fibrosis or permanent damage to the liver based on noninvasive testing.  F score 0 on fibro-test and APRI and FIB4 are also both in the normal range to where her risk for fibrosis is statistically very low. No need for ultrasound/elastography.  She has genotype 1a - OK to treat with Harvoni or Mavyret x 8 weeks depending on what is preferred for her insurance.    Janene Madeira, MSN, NP-C [facility] for Infectious Disease [email or web address] Pager: [phone] Office: [phone] [place]: [phone]

## 2018-09-20 NOTE — Progress Notes (Signed)
Sending in [place] Rx to [facility] for patient's chronic Hepatitis C infection. Heather Carney will start PA process.

## 2018-09-20 NOTE — Telephone Encounter (Signed)
[facility] Patient Advocate Encounter   Received notification from [facility] [employer] that prior authorization for Harvoni is required.   PA submitted on 09/20/2018 Key [id number] Status is pending    [facility] will continue to follow.  Bartholomew Crews, CPhT Specialty Pharmacy Patient [facility] for Infectious Disease Phone: [phone] Fax: [phone] 09/20/2018 4:27 PM

## 2018-09-20 NOTE — Progress Notes (Signed)
Her insurance does not prefer one over the other on initial glance. I will send in [place] x 8 weeks so she only has to take one pill!

## 2018-09-20 NOTE — Progress Notes (Signed)
Patient called on listed number. Female answered. I requested to have her give her doctor's office a call back for test results.   She has no significant findings of fibrosis or permanent damage to the liver based on noninvasive testing.  F score 0 on fibro-test and APRI and FIB4 are also both in the normal range to where her risk for fibrosis is statistically very low. No need for ultrasound/elastography.  She has genotype 1a - OK to treat with Harvoni or Mavyret x 8 weeks depending on what is preferred for her insurance.

## 2018-09-21 NOTE — Telephone Encounter (Signed)
[facility] Patient Advocate Encounter  Prior Authorization for [place] Males has been approved.    PA# [id number] Effective dates: 08/21/2018 through 11/15/2018  Patients co-pay is $65.00   [facility] will continue to follow.  Bartholomew Crews, CPhT Specialty Pharmacy Patient [facility] for Infectious Disease Phone: [phone] Fax: [phone] 09/21/2018 8:25 AM

## 2018-09-23 NOTE — Telephone Encounter (Signed)
[facility] Patient Advocate Encounter  Patient has been approved through the [employer] program to receive Harvoni for 8 weeks with an effective date range of 08/21/2018 through 11/15/2018. I have attempted to reach the patient this week to set up pick up or shipment information for the medication from [facility] but was unsuccessful in reaching her.    09/21/2018 spoke with grandfather @ [phone] he wrote down our phone number and stated he would give with message to [facility] whenever she showed up   09/23/2018 no answer, left message on mom's cell phone [phone] patient indicated at time of appointment it was okay to speak to the mom, that she was helping keep information while she got things back together  Will continue to follow-up in hopes that we can get the initial start in a time sensitive manner to the prior authorization date range.   [facility] to help with copayment RxBin: [id number] PCN: [id number] Member ID: [id number] Group ID: [id number]

## 2018-09-23 NOTE — Telephone Encounter (Signed)
Thanks for you efforts fo get her her meds!

## 2018-09-29 ENCOUNTER — Telehealth: Payer: Self-pay | Admitting: Pharmacist

## 2018-09-29 ENCOUNTER — Telehealth: Payer: Self-pay | Admitting: Pharmacy Technician

## 2018-09-29 MED FILL — LEDIPASVIR-SOFOSBUVIR 90-40: 90-400 | 28 days supply | Qty: 28 | Fill #0

## 2018-09-29 NOTE — Telephone Encounter (Signed)
Thank you :)

## 2018-09-29 NOTE — Telephone Encounter (Signed)
Error- other chart

## 2018-09-29 NOTE — Telephone Encounter (Signed)
Patient is approved to receive Harvoni x 8 weeks for chronic Hepatitis C infection. Counseled patient to take Harvoni daily with or without food. Encouraged patient not to miss any doses and explained how their chance of cure could go down with each dose missed.  Counseled patient on what to do if dose is missed - if it is closer to the missed dose take immediately; if closer to next dose then skip dose and take the next dose at the usual time.   Counseled patient on common side effects such as headache, fatigue, and nausea and that these normally decrease with time. I reviewed patient medications and found no interactions. Discussed with patient that there are several drug interactions including acid suppressants. Instructed patient to call clinic if she wishes to start a new medication during course of therapy. Also advised patient to call if she experiences side effects. Patient will follow-up with me in the pharmacy clinic on 7/28.

## 2018-09-29 NOTE — Telephone Encounter (Signed)
[facility] Patient Advocate Encounter   Was successful in obtaining a [employer] copay card for Ledipasvir-Sofosbuvir . This copay card will make the patients copay $5.00.  I have spoken with the patient and she would like to start her medication on Friday, 06/26. The pharmacy will be mailing her medication.    The billing information is as follows and has been shared with [place].  RxBin: [id number] PCN: [id number] Member ID: [id number] Group ID: [id number]

## 2018-10-28 ENCOUNTER — Telehealth: Payer: Self-pay | Admitting: Pharmacy Technician

## 2018-10-28 NOTE — Telephone Encounter (Signed)
Oh no! Thank you kindly for your efforts to track her down. I am hopeful she is OK and will continue treatment as planned.

## 2018-10-28 NOTE — Telephone Encounter (Signed)
It is time for Ms. Shipper's Harvoni refill (second and final month).  I tried the listed phone numbers and they have all changed.  Tried the new number [phone] and the grandfather answered and said she no longer lives there and does not have a phone and there is no way for him to reach her.    I tried the mothers number and it has been reassigned to someone else.  I have no way to reach her or her mother.  We can hope that she shows up for her appointment at the clinic Monday 11/01/2018.  Heather Carney. Nadara Mustard Leonore Patient [facility] Phone: [phone] Fax:  [phone]

## 2018-11-02 ENCOUNTER — Ambulatory Visit: Payer: Federal, State, Local not specified - PPO | Admitting: Pharmacist

## 2018-11-16 DIAGNOSIS — G2581 Restless legs syndrome: Secondary | ICD-10-CM | POA: Diagnosis not present

## 2018-11-22 DIAGNOSIS — B9689 Other specified bacterial agents as the cause of diseases classified elsewhere: Secondary | ICD-10-CM | POA: Diagnosis not present

## 2018-11-22 DIAGNOSIS — R112 Nausea with vomiting, unspecified: Secondary | ICD-10-CM | POA: Diagnosis not present

## 2018-11-22 DIAGNOSIS — R109 Unspecified abdominal pain: Secondary | ICD-10-CM | POA: Diagnosis not present

## 2018-11-22 DIAGNOSIS — N390 Urinary tract infection, site not specified: Secondary | ICD-10-CM | POA: Diagnosis not present

## 2018-11-22 DIAGNOSIS — R1110 Vomiting, unspecified: Secondary | ICD-10-CM | POA: Diagnosis not present

## 2018-11-22 DIAGNOSIS — F17210 Nicotine dependence, cigarettes, uncomplicated: Secondary | ICD-10-CM | POA: Diagnosis not present

## 2018-11-22 DIAGNOSIS — K529 Noninfective gastroenteritis and colitis, unspecified: Secondary | ICD-10-CM | POA: Diagnosis not present

## 2018-12-04 DIAGNOSIS — R05 Cough: Secondary | ICD-10-CM | POA: Diagnosis not present

## 2018-12-04 DIAGNOSIS — R0602 Shortness of breath: Secondary | ICD-10-CM | POA: Diagnosis not present

## 2018-12-04 DIAGNOSIS — R062 Wheezing: Secondary | ICD-10-CM | POA: Diagnosis not present

## 2018-12-04 DIAGNOSIS — Z03818 Encounter for observation for suspected exposure to other biological agents ruled out: Secondary | ICD-10-CM | POA: Diagnosis not present

## 2018-12-04 DIAGNOSIS — Z20828 Contact with and (suspected) exposure to other viral communicable diseases: Secondary | ICD-10-CM | POA: Diagnosis not present

## 2019-01-24 DIAGNOSIS — Z79899 Other long term (current) drug therapy: Secondary | ICD-10-CM | POA: Diagnosis not present

## 2019-02-07 DIAGNOSIS — Z79899 Other long term (current) drug therapy: Secondary | ICD-10-CM | POA: Diagnosis not present

## 2019-03-01 DIAGNOSIS — F0630 Mood disorder due to known physiological condition, unspecified: Secondary | ICD-10-CM | POA: Diagnosis not present

## 2019-03-01 DIAGNOSIS — F419 Anxiety disorder, unspecified: Secondary | ICD-10-CM | POA: Diagnosis not present

## 2019-03-01 DIAGNOSIS — F331 Major depressive disorder, recurrent, moderate: Secondary | ICD-10-CM | POA: Diagnosis not present

## 2019-03-01 DIAGNOSIS — Z9229 Personal history of other drug therapy: Secondary | ICD-10-CM | POA: Diagnosis not present

## 2019-03-15 DIAGNOSIS — F4321 Adjustment disorder with depressed mood: Secondary | ICD-10-CM | POA: Diagnosis not present

## 2019-03-15 DIAGNOSIS — Z915 Personal history of self-harm: Secondary | ICD-10-CM | POA: Diagnosis not present

## 2019-03-15 DIAGNOSIS — Z5181 Encounter for therapeutic drug level monitoring: Secondary | ICD-10-CM | POA: Diagnosis not present

## 2019-03-15 DIAGNOSIS — F419 Anxiety disorder, unspecified: Secondary | ICD-10-CM | POA: Diagnosis not present

## 2019-04-12 DIAGNOSIS — Z7251 High risk heterosexual behavior: Secondary | ICD-10-CM | POA: Diagnosis not present

## 2019-04-12 DIAGNOSIS — N898 Other specified noninflammatory disorders of vagina: Secondary | ICD-10-CM | POA: Diagnosis not present

## 2019-04-12 DIAGNOSIS — F4321 Adjustment disorder with depressed mood: Secondary | ICD-10-CM | POA: Diagnosis not present

## 2019-04-12 DIAGNOSIS — F419 Anxiety disorder, unspecified: Secondary | ICD-10-CM | POA: Diagnosis not present

## 2019-04-12 DIAGNOSIS — Z5181 Encounter for therapeutic drug level monitoring: Secondary | ICD-10-CM | POA: Diagnosis not present

## 2019-04-19 DIAGNOSIS — Z791 Long term (current) use of non-steroidal anti-inflammatories (NSAID): Secondary | ICD-10-CM | POA: Diagnosis not present

## 2019-04-19 DIAGNOSIS — R110 Nausea: Secondary | ICD-10-CM | POA: Diagnosis not present

## 2019-04-19 DIAGNOSIS — R109 Unspecified abdominal pain: Secondary | ICD-10-CM | POA: Diagnosis not present

## 2019-04-19 DIAGNOSIS — B1920 Unspecified viral hepatitis C without hepatic coma: Secondary | ICD-10-CM | POA: Diagnosis not present

## 2019-06-02 DIAGNOSIS — B1920 Unspecified viral hepatitis C without hepatic coma: Secondary | ICD-10-CM | POA: Diagnosis not present

## 2019-06-14 DIAGNOSIS — Z79899 Other long term (current) drug therapy: Secondary | ICD-10-CM | POA: Diagnosis not present

## 2019-07-12 DIAGNOSIS — O99320 Drug use complicating pregnancy, unspecified trimester: Secondary | ICD-10-CM | POA: Diagnosis not present

## 2019-07-15 DIAGNOSIS — N912 Amenorrhea, unspecified: Secondary | ICD-10-CM | POA: Diagnosis not present

## 2019-07-15 DIAGNOSIS — Z3689 Encounter for other specified antenatal screening: Secondary | ICD-10-CM | POA: Diagnosis not present

## 2019-07-15 DIAGNOSIS — Z3A14 14 weeks gestation of pregnancy: Secondary | ICD-10-CM | POA: Diagnosis not present

## 2019-09-08 DIAGNOSIS — Z3A22 22 weeks gestation of pregnancy: Secondary | ICD-10-CM | POA: Diagnosis not present

## 2019-09-08 DIAGNOSIS — Z3689 Encounter for other specified antenatal screening: Secondary | ICD-10-CM | POA: Diagnosis not present

## 2019-10-20 ENCOUNTER — Inpatient Hospital Stay (HOSPITAL_COMMUNITY)
Admission: AD | Admit: 2019-10-20 | Discharge: 2019-10-20 | Disposition: A | Discharge status: Home / Self Care | Payer: Federal, State, Local not specified - PPO | Attending: Obstetrics and Gynecology | Admitting: Obstetrics and Gynecology

## 2019-10-20 ENCOUNTER — Other Ambulatory Visit: Payer: Self-pay

## 2019-10-20 ENCOUNTER — Encounter (HOSPITAL_COMMUNITY): Payer: Self-pay

## 2019-10-20 DIAGNOSIS — Z3A28 28 weeks gestation of pregnancy: Secondary | ICD-10-CM

## 2019-10-20 DIAGNOSIS — F17200 Nicotine dependence, unspecified, uncomplicated: Secondary | ICD-10-CM | POA: Insufficient documentation

## 2019-10-20 DIAGNOSIS — Z818 Family history of other mental and behavioral disorders: Secondary | ICD-10-CM | POA: Diagnosis not present

## 2019-10-20 DIAGNOSIS — K529 Noninfective gastroenteritis and colitis, unspecified: Secondary | ICD-10-CM | POA: Diagnosis not present

## 2019-10-20 DIAGNOSIS — Z79899 Other long term (current) drug therapy: Secondary | ICD-10-CM | POA: Insufficient documentation

## 2019-10-20 DIAGNOSIS — M5489 Other dorsalgia: Secondary | ICD-10-CM | POA: Diagnosis not present

## 2019-10-20 DIAGNOSIS — O218 Other vomiting complicating pregnancy: Secondary | ICD-10-CM | POA: Insufficient documentation

## 2019-10-20 DIAGNOSIS — F329 Major depressive disorder, single episode, unspecified: Secondary | ICD-10-CM | POA: Insufficient documentation

## 2019-10-20 DIAGNOSIS — Z91040 Latex allergy status: Secondary | ICD-10-CM | POA: Insufficient documentation

## 2019-10-20 DIAGNOSIS — O99343 Other mental disorders complicating pregnancy, third trimester: Secondary | ICD-10-CM | POA: Insufficient documentation

## 2019-10-20 DIAGNOSIS — R1084 Generalized abdominal pain: Secondary | ICD-10-CM | POA: Diagnosis not present

## 2019-10-20 DIAGNOSIS — O99612 Diseases of the digestive system complicating pregnancy, second trimester: Secondary | ICD-10-CM | POA: Insufficient documentation

## 2019-10-20 DIAGNOSIS — O99613 Diseases of the digestive system complicating pregnancy, third trimester: Secondary | ICD-10-CM | POA: Diagnosis not present

## 2019-10-20 DIAGNOSIS — I959 Hypotension, unspecified: Secondary | ICD-10-CM | POA: Diagnosis not present

## 2019-10-20 DIAGNOSIS — R52 Pain, unspecified: Secondary | ICD-10-CM | POA: Diagnosis not present

## 2019-10-20 DIAGNOSIS — O99333 Smoking (tobacco) complicating pregnancy, third trimester: Secondary | ICD-10-CM | POA: Diagnosis not present

## 2019-10-20 LAB — URINALYSIS, ROUTINE W REFLEX MICROSCOPIC
Bilirubin Urine: NEGATIVE
Glucose, UA: NEGATIVE mg/dL
Hgb urine dipstick: NEGATIVE
Ketones, ur: 20 mg/dL — AB
Nitrite: NEGATIVE
Protein, ur: NEGATIVE mg/dL
Specific Gravity, Urine: 1.013 (ref 1.005–1.030)
pH: 7 (ref 5.0–8.0)

## 2019-10-20 MED ORDER — LACTATED RINGERS IV SOLN: INTRAVENOUS | Status: DC

## 2019-10-20 MED ORDER — PROMETHAZINE HCL 25 MG PO TABS: 25.0000 mg | ORAL_TABLET | Freq: Four times a day (QID) | ORAL | 0 refills | Status: AC | PRN

## 2019-10-20 MED ORDER — ONDANSETRON HCL 4 MG/2ML IJ SOLN
4.0000 mg | Freq: Once | INTRAMUSCULAR | Status: AC
  Administered 2019-10-20: 4 mg via INTRAVENOUS
  Filled 2019-10-20: qty 2

## 2019-10-20 MED ORDER — LACTATED RINGERS IV BOLUS
1000.0000 mL | Freq: Once | INTRAVENOUS | Status: AC
  Administered 2019-10-20: 1000 mL via INTRAVENOUS

## 2019-10-20 MED ORDER — PROMETHAZINE HCL 25 MG/ML IJ SOLN
25.0000 mg | Freq: Once | INTRAMUSCULAR | Status: AC
  Administered 2019-10-20: 25 mg via INTRAVENOUS
  Filled 2019-10-20: qty 1

## 2019-10-20 MED ORDER — FAMOTIDINE IN NACL 20-0.9 MG/50ML-% IV SOLN
20.0000 mg | Freq: Once | INTRAVENOUS | Status: AC
  Administered 2019-10-20: 20 mg via INTRAVENOUS
  Filled 2019-10-20: qty 50

## 2019-10-20 NOTE — Discharge Instructions (Signed)
Viral Gastroenteritis, Adult  Viral gastroenteritis is also known as the stomach flu. This condition may affect your stomach, small intestine, and large intestine. It can cause sudden watery diarrhea, fever, and vomiting. This condition is caused by many different viruses. These viruses can be passed from person to person very easily (are contagious). Diarrhea and vomiting can make you feel weak and cause you to become dehydrated. You may not be able to keep fluids down. Dehydration can make you tired and thirsty, cause you to have a dry mouth, and decrease how often you urinate. It is important to replace the fluids that you lose from diarrhea and vomiting. What are the causes? Gastroenteritis is caused by many viruses, including rotavirus and norovirus. Norovirus is the most common cause in adults. You can get sick after being exposed to the viruses from other people. You can also get sick by:  Eating food, drinking water, or touching a surface contaminated with one of these viruses.  Sharing utensils or other personal items with an infected person. What increases the risk? You are more likely to develop this condition if you:  Have a weak body defense system (immune system).  Live with one or more children who are younger than 2 years old.  Live in a nursing home.  Travel on cruise ships. What are the signs or symptoms? Symptoms of this condition start suddenly 1-3 days after exposure to a virus. Symptoms may last for a few days or for as long as a week. Common symptoms include watery diarrhea and vomiting. Other symptoms include:  Fever.  Headache.  Fatigue.  Pain in the abdomen.  Chills.  Weakness.  Nausea.  Muscle aches.  Loss of appetite. How is this diagnosed? This condition is diagnosed with a medical history and physical exam. You may also have a stool test to check for viruses or other infections. How is this treated? This condition typically goes away on its  own. The focus of treatment is to prevent dehydration and restore lost fluids (rehydration). This condition may be treated with:  An oral rehydration solution (ORS) to replace important salts and minerals (electrolytes) in your body. Take this if told by your health care provider. This is a drink that is sold at pharmacies and retail stores.  Medicines to help with your symptoms.  Probiotic supplements to reduce symptoms of diarrhea.  Fluids given through an IV, if dehydration is severe. Older adults and people with other diseases or a weak immune system are at higher risk for dehydration. Follow these instructions at home:  Eating and drinking   Take an ORS as told by your health care provider.  Drink clear fluids in small amounts as you are able. Clear fluids include: ? Water. ? Ice chips. ? Diluted fruit juice. ? Low-calorie sports drinks.  Drink enough fluid to keep your urine pale yellow.  Eat small amounts of healthy foods every 3-4 hours as you are able. This may include whole grains, fruits, vegetables, lean meats, and yogurt.  Avoid fluids that contain a lot of sugar or caffeine, such as energy drinks, sports drinks, and soda.  Avoid spicy or fatty foods.  Avoid alcohol. General instructions  Wash your hands often, especially after having diarrhea or vomiting. If soap and water are not available, use hand sanitizer.  Make sure that all people in your household wash their hands well and often.  Take over-the-counter and prescription medicines only as told by your health care provider.  Rest at   home while you recover.  Watch your condition for any changes.  Take a warm bath to relieve any burning or pain from frequent diarrhea episodes.  Keep all follow-up visits as told by your health care provider. This is important. Contact a health care provider if you:  Cannot keep fluids down.  Have symptoms that get worse.  Have new symptoms.  Feel light-headed or  dizzy.  Have muscle cramps. Get help right away if you:  Have chest pain.  Feel extremely weak or you faint.  See blood in your vomit.  Have vomit that looks like coffee grounds.  Have bloody or black stools or stools that look like tar.  Have a severe headache, a stiff neck, or both.  Have a rash.  Have severe pain, cramping, or bloating in your abdomen.  Have trouble breathing or you are breathing very quickly.  Have a fast heartbeat.  Have skin that feels cold and clammy.  Feel confused.  Have pain when you urinate.  Have signs of dehydration, such as: ? Dark urine, very little urine, or no urine. ? Cracked lips. ? Dry mouth. ? Sunken eyes. ? Sleepiness. ? Weakness. Summary  Viral gastroenteritis is also known as the stomach flu. It can cause sudden watery diarrhea, fever, and vomiting.  This condition can be passed from person to person very easily (is contagious).  Take an ORS if told by your health care provider. This is a drink that is sold at pharmacies and retail stores.  Wash your hands often, especially after having diarrhea or vomiting. If soap and water are not available, use hand sanitizer. This information is not intended to replace advice given to you by your health care provider. Make sure you discuss any questions you have with your health care provider. Document Revised: 09/10/2018 Document Reviewed: 01/27/2018 Elsevier Patient Education  2020 Elsevier Inc.  

## 2019-10-20 NOTE — MAU Note (Signed)
.   Heather Carney is a 24 y.o. at [redacted]w[redacted]d here in MAU reporting: Nausea/vomiting/diarrhea that started at 0000 this morning. She has had vomiting x9 times and diarrhea x4. Patient also reports sharp cramps in her lower abdomen that started at 0300 that come and go. Patient reports good fetal movement. No VB or LOF. No recent intercourse.   Pain score: 7 Vitals:   10/20/19 0847  BP: 116/60  Pulse: (!) 107  Resp: 18  Temp: 99.4 F (37.4 C)  SpO2: 100%     FHT:168 Lab orders placed from triage:
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
## 2019-10-21 LAB — CULTURE, OB URINE: Culture: NO GROWTH
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
## 2019-10-25 DIAGNOSIS — Z3201 Encounter for pregnancy test, result positive: Secondary | ICD-10-CM | POA: Diagnosis not present

## 2019-10-25 DIAGNOSIS — Z3A29 29 weeks gestation of pregnancy: Secondary | ICD-10-CM | POA: Diagnosis not present

## 2019-10-25 DIAGNOSIS — F1120 Opioid dependence, uncomplicated: Secondary | ICD-10-CM | POA: Diagnosis not present

## 2019-10-25 DIAGNOSIS — O99323 Drug use complicating pregnancy, third trimester: Secondary | ICD-10-CM | POA: Diagnosis not present

## 2019-10-26 DIAGNOSIS — Z3A29 29 weeks gestation of pregnancy: Secondary | ICD-10-CM | POA: Diagnosis not present

## 2019-10-26 DIAGNOSIS — F332 Major depressive disorder, recurrent severe without psychotic features: Secondary | ICD-10-CM | POA: Diagnosis not present

## 2019-10-26 DIAGNOSIS — O99323 Drug use complicating pregnancy, third trimester: Secondary | ICD-10-CM | POA: Diagnosis not present

## 2019-10-26 DIAGNOSIS — F1120 Opioid dependence, uncomplicated: Secondary | ICD-10-CM | POA: Diagnosis not present

## 2019-10-27 DIAGNOSIS — F1120 Opioid dependence, uncomplicated: Secondary | ICD-10-CM | POA: Diagnosis not present

## 2019-10-27 DIAGNOSIS — O99320 Drug use complicating pregnancy, unspecified trimester: Secondary | ICD-10-CM | POA: Diagnosis not present

## 2019-10-27 DIAGNOSIS — Z3A29 29 weeks gestation of pregnancy: Secondary | ICD-10-CM | POA: Diagnosis not present

## 2019-10-27 DIAGNOSIS — F332 Major depressive disorder, recurrent severe without psychotic features: Secondary | ICD-10-CM | POA: Diagnosis not present

## 2019-10-28 DIAGNOSIS — F332 Major depressive disorder, recurrent severe without psychotic features: Secondary | ICD-10-CM | POA: Diagnosis not present

## 2019-10-28 DIAGNOSIS — Z3A29 29 weeks gestation of pregnancy: Secondary | ICD-10-CM | POA: Diagnosis not present

## 2019-10-28 DIAGNOSIS — F1120 Opioid dependence, uncomplicated: Secondary | ICD-10-CM | POA: Diagnosis not present

## 2019-10-28 DIAGNOSIS — O99320 Drug use complicating pregnancy, unspecified trimester: Secondary | ICD-10-CM | POA: Diagnosis not present

## 2019-10-29 DIAGNOSIS — O99320 Drug use complicating pregnancy, unspecified trimester: Secondary | ICD-10-CM | POA: Diagnosis not present

## 2019-10-29 DIAGNOSIS — F1120 Opioid dependence, uncomplicated: Secondary | ICD-10-CM | POA: Diagnosis not present

## 2019-10-29 DIAGNOSIS — F332 Major depressive disorder, recurrent severe without psychotic features: Secondary | ICD-10-CM | POA: Diagnosis not present

## 2019-10-29 DIAGNOSIS — Z3A29 29 weeks gestation of pregnancy: Secondary | ICD-10-CM | POA: Diagnosis not present

## 2019-10-31 DIAGNOSIS — Z79899 Other long term (current) drug therapy: Secondary | ICD-10-CM | POA: Diagnosis not present

## 2019-11-07 DIAGNOSIS — Z3689 Encounter for other specified antenatal screening: Secondary | ICD-10-CM | POA: Diagnosis not present

## 2019-11-07 DIAGNOSIS — L299 Pruritus, unspecified: Secondary | ICD-10-CM | POA: Diagnosis not present

## 2019-11-07 DIAGNOSIS — Z131 Encounter for screening for diabetes mellitus: Secondary | ICD-10-CM | POA: Diagnosis not present

## 2019-11-16 DIAGNOSIS — Z3689 Encounter for other specified antenatal screening: Secondary | ICD-10-CM | POA: Diagnosis not present

## 2019-11-24 DIAGNOSIS — Z3A33 33 weeks gestation of pregnancy: Secondary | ICD-10-CM | POA: Diagnosis not present

## 2019-11-24 DIAGNOSIS — O358XX0 Maternal care for other (suspected) fetal abnormality and damage, not applicable or unspecified: Secondary | ICD-10-CM | POA: Diagnosis not present

## 2019-12-14 DIAGNOSIS — Z3689 Encounter for other specified antenatal screening: Secondary | ICD-10-CM | POA: Diagnosis not present

## 2019-12-14 DIAGNOSIS — Z3A36 36 weeks gestation of pregnancy: Secondary | ICD-10-CM | POA: Diagnosis not present

## 2019-12-14 DIAGNOSIS — O99323 Drug use complicating pregnancy, third trimester: Secondary | ICD-10-CM | POA: Diagnosis not present

## 2019-12-14 DIAGNOSIS — F1120 Opioid dependence, uncomplicated: Secondary | ICD-10-CM | POA: Diagnosis not present

## 2020-01-01 DIAGNOSIS — Z3A00 Weeks of gestation of pregnancy not specified: Secondary | ICD-10-CM | POA: Diagnosis not present

## 2020-01-01 DIAGNOSIS — O4690 Antepartum hemorrhage, unspecified, unspecified trimester: Secondary | ICD-10-CM | POA: Diagnosis not present

## 2020-01-01 DIAGNOSIS — O479 False labor, unspecified: Secondary | ICD-10-CM | POA: Diagnosis not present

## 2020-01-05 DIAGNOSIS — O26899 Other specified pregnancy related conditions, unspecified trimester: Secondary | ICD-10-CM | POA: Diagnosis not present

## 2020-01-05 DIAGNOSIS — Z3A00 Weeks of gestation of pregnancy not specified: Secondary | ICD-10-CM | POA: Diagnosis not present

## 2020-01-07 DIAGNOSIS — Z79899 Other long term (current) drug therapy: Secondary | ICD-10-CM | POA: Diagnosis not present

## 2020-01-07 DIAGNOSIS — O9902 Anemia complicating childbirth: Secondary | ICD-10-CM | POA: Diagnosis not present

## 2020-01-07 DIAGNOSIS — U071 COVID-19: Secondary | ICD-10-CM | POA: Diagnosis not present

## 2020-01-07 DIAGNOSIS — D62 Acute posthemorrhagic anemia: Secondary | ICD-10-CM | POA: Diagnosis not present

## 2020-01-07 DIAGNOSIS — O99324 Drug use complicating childbirth: Secondary | ICD-10-CM | POA: Diagnosis not present

## 2020-01-07 DIAGNOSIS — Z87891 Personal history of nicotine dependence: Secondary | ICD-10-CM | POA: Diagnosis not present

## 2020-01-07 DIAGNOSIS — F419 Anxiety disorder, unspecified: Secondary | ICD-10-CM | POA: Diagnosis not present

## 2020-01-07 DIAGNOSIS — F1190 Opioid use, unspecified, uncomplicated: Secondary | ICD-10-CM | POA: Diagnosis not present

## 2020-01-07 DIAGNOSIS — O9852 Other viral diseases complicating childbirth: Secondary | ICD-10-CM | POA: Diagnosis not present

## 2020-01-07 DIAGNOSIS — O99344 Other mental disorders complicating childbirth: Secondary | ICD-10-CM | POA: Diagnosis not present

## 2020-01-07 DIAGNOSIS — B1920 Unspecified viral hepatitis C without hepatic coma: Secondary | ICD-10-CM | POA: Diagnosis not present

## 2020-01-07 DIAGNOSIS — Z3A39 39 weeks gestation of pregnancy: Secondary | ICD-10-CM | POA: Diagnosis not present

## 2020-01-07 DIAGNOSIS — F1120 Opioid dependence, uncomplicated: Secondary | ICD-10-CM | POA: Diagnosis not present

## 2020-01-10 DIAGNOSIS — O99320 Drug use complicating pregnancy, unspecified trimester: Secondary | ICD-10-CM | POA: Diagnosis not present

## 2020-01-10 DIAGNOSIS — F1910 Other psychoactive substance abuse, uncomplicated: Secondary | ICD-10-CM | POA: Diagnosis not present

## 2020-01-10 DIAGNOSIS — F331 Major depressive disorder, recurrent, moderate: Secondary | ICD-10-CM | POA: Diagnosis not present

## 2020-01-10 DIAGNOSIS — F1121 Opioid dependence, in remission: Secondary | ICD-10-CM | POA: Diagnosis not present

## 2020-01-24 DIAGNOSIS — B1920 Unspecified viral hepatitis C without hepatic coma: Secondary | ICD-10-CM | POA: Diagnosis not present

## 2020-01-24 DIAGNOSIS — F411 Generalized anxiety disorder: Secondary | ICD-10-CM | POA: Diagnosis not present

## 2020-01-24 DIAGNOSIS — Z87891 Personal history of nicotine dependence: Secondary | ICD-10-CM | POA: Diagnosis not present

## 2020-01-24 DIAGNOSIS — F1121 Opioid dependence, in remission: Secondary | ICD-10-CM | POA: Diagnosis not present

## 2020-01-24 DIAGNOSIS — F331 Major depressive disorder, recurrent, moderate: Secondary | ICD-10-CM | POA: Diagnosis not present

## 2020-02-07 DIAGNOSIS — F411 Generalized anxiety disorder: Secondary | ICD-10-CM | POA: Diagnosis not present

## 2020-02-07 DIAGNOSIS — F1121 Opioid dependence, in remission: Secondary | ICD-10-CM | POA: Diagnosis not present

## 2020-02-07 DIAGNOSIS — F331 Major depressive disorder, recurrent, moderate: Secondary | ICD-10-CM | POA: Diagnosis not present

## 2020-02-07 DIAGNOSIS — B1920 Unspecified viral hepatitis C without hepatic coma: Secondary | ICD-10-CM | POA: Diagnosis not present

## 2020-02-23 DIAGNOSIS — F1121 Opioid dependence, in remission: Secondary | ICD-10-CM | POA: Diagnosis not present

## 2020-02-23 DIAGNOSIS — F331 Major depressive disorder, recurrent, moderate: Secondary | ICD-10-CM | POA: Diagnosis not present

## 2020-02-23 DIAGNOSIS — F411 Generalized anxiety disorder: Secondary | ICD-10-CM | POA: Diagnosis not present

## 2020-02-23 DIAGNOSIS — B1920 Unspecified viral hepatitis C without hepatic coma: Secondary | ICD-10-CM | POA: Diagnosis not present

## 2020-03-08 DIAGNOSIS — Z30013 Encounter for initial prescription of injectable contraceptive: Secondary | ICD-10-CM | POA: Diagnosis not present

## 2020-03-08 DIAGNOSIS — Z3202 Encounter for pregnancy test, result negative: Secondary | ICD-10-CM | POA: Diagnosis not present

## 2020-03-15 DIAGNOSIS — F411 Generalized anxiety disorder: Secondary | ICD-10-CM | POA: Diagnosis not present

## 2020-03-15 DIAGNOSIS — B1920 Unspecified viral hepatitis C without hepatic coma: Secondary | ICD-10-CM | POA: Diagnosis not present

## 2020-03-15 DIAGNOSIS — F331 Major depressive disorder, recurrent, moderate: Secondary | ICD-10-CM | POA: Diagnosis not present

## 2020-03-15 DIAGNOSIS — F1121 Opioid dependence, in remission: Secondary | ICD-10-CM | POA: Diagnosis not present

## 2020-03-29 DIAGNOSIS — B1920 Unspecified viral hepatitis C without hepatic coma: Secondary | ICD-10-CM | POA: Diagnosis not present

## 2020-03-29 DIAGNOSIS — F411 Generalized anxiety disorder: Secondary | ICD-10-CM | POA: Diagnosis not present

## 2020-03-29 DIAGNOSIS — F1121 Opioid dependence, in remission: Secondary | ICD-10-CM | POA: Diagnosis not present

## 2020-03-29 DIAGNOSIS — F331 Major depressive disorder, recurrent, moderate: Secondary | ICD-10-CM | POA: Diagnosis not present

## 2020-04-26 DIAGNOSIS — F331 Major depressive disorder, recurrent, moderate: Secondary | ICD-10-CM | POA: Diagnosis not present

## 2020-04-26 DIAGNOSIS — F1121 Opioid dependence, in remission: Secondary | ICD-10-CM | POA: Diagnosis not present

## 2020-04-26 DIAGNOSIS — B1920 Unspecified viral hepatitis C without hepatic coma: Secondary | ICD-10-CM | POA: Diagnosis not present

## 2020-04-26 DIAGNOSIS — F411 Generalized anxiety disorder: Secondary | ICD-10-CM | POA: Diagnosis not present

## 2020-05-17 DIAGNOSIS — F1121 Opioid dependence, in remission: Secondary | ICD-10-CM | POA: Diagnosis not present

## 2020-05-17 DIAGNOSIS — F331 Major depressive disorder, recurrent, moderate: Secondary | ICD-10-CM | POA: Diagnosis not present

## 2020-05-17 DIAGNOSIS — B1920 Unspecified viral hepatitis C without hepatic coma: Secondary | ICD-10-CM | POA: Diagnosis not present

## 2020-05-17 DIAGNOSIS — F411 Generalized anxiety disorder: Secondary | ICD-10-CM | POA: Diagnosis not present

## 2020-06-21 DIAGNOSIS — F1121 Opioid dependence, in remission: Secondary | ICD-10-CM | POA: Diagnosis not present

## 2020-06-21 DIAGNOSIS — Z79899 Other long term (current) drug therapy: Secondary | ICD-10-CM | POA: Diagnosis not present

## 2020-06-21 DIAGNOSIS — B1920 Unspecified viral hepatitis C without hepatic coma: Secondary | ICD-10-CM | POA: Diagnosis not present

## 2020-06-21 DIAGNOSIS — F411 Generalized anxiety disorder: Secondary | ICD-10-CM | POA: Diagnosis not present

## 2020-06-21 DIAGNOSIS — F331 Major depressive disorder, recurrent, moderate: Secondary | ICD-10-CM | POA: Diagnosis not present

## 2020-07-05 DIAGNOSIS — B1920 Unspecified viral hepatitis C without hepatic coma: Secondary | ICD-10-CM | POA: Diagnosis not present

## 2020-07-05 DIAGNOSIS — F331 Major depressive disorder, recurrent, moderate: Secondary | ICD-10-CM | POA: Diagnosis not present

## 2020-07-05 DIAGNOSIS — F1121 Opioid dependence, in remission: Secondary | ICD-10-CM | POA: Diagnosis not present

## 2020-07-05 DIAGNOSIS — F411 Generalized anxiety disorder: Secondary | ICD-10-CM | POA: Diagnosis not present

## 2020-07-13 DIAGNOSIS — Z3042 Encounter for surveillance of injectable contraceptive: Secondary | ICD-10-CM | POA: Diagnosis not present

## 2020-07-13 DIAGNOSIS — Z3202 Encounter for pregnancy test, result negative: Secondary | ICD-10-CM | POA: Diagnosis not present

## 2020-07-26 DIAGNOSIS — F411 Generalized anxiety disorder: Secondary | ICD-10-CM | POA: Diagnosis not present

## 2020-07-26 DIAGNOSIS — B1920 Unspecified viral hepatitis C without hepatic coma: Secondary | ICD-10-CM | POA: Diagnosis not present

## 2020-07-26 DIAGNOSIS — F331 Major depressive disorder, recurrent, moderate: Secondary | ICD-10-CM | POA: Diagnosis not present

## 2020-07-26 DIAGNOSIS — F1121 Opioid dependence, in remission: Secondary | ICD-10-CM | POA: Diagnosis not present

## 2020-08-16 DIAGNOSIS — F411 Generalized anxiety disorder: Secondary | ICD-10-CM | POA: Diagnosis not present

## 2020-08-16 DIAGNOSIS — B1920 Unspecified viral hepatitis C without hepatic coma: Secondary | ICD-10-CM | POA: Diagnosis not present

## 2020-08-16 DIAGNOSIS — F1121 Opioid dependence, in remission: Secondary | ICD-10-CM | POA: Diagnosis not present

## 2020-08-16 DIAGNOSIS — F331 Major depressive disorder, recurrent, moderate: Secondary | ICD-10-CM | POA: Diagnosis not present

## 2020-08-17 DIAGNOSIS — R102 Pelvic and perineal pain: Secondary | ICD-10-CM | POA: Diagnosis not present

## 2020-09-06 DIAGNOSIS — B1920 Unspecified viral hepatitis C without hepatic coma: Secondary | ICD-10-CM | POA: Diagnosis not present

## 2020-09-06 DIAGNOSIS — F331 Major depressive disorder, recurrent, moderate: Secondary | ICD-10-CM | POA: Diagnosis not present

## 2020-09-06 DIAGNOSIS — Z79899 Other long term (current) drug therapy: Secondary | ICD-10-CM | POA: Diagnosis not present

## 2020-09-06 DIAGNOSIS — F1121 Opioid dependence, in remission: Secondary | ICD-10-CM | POA: Diagnosis not present

## 2020-09-06 DIAGNOSIS — F411 Generalized anxiety disorder: Secondary | ICD-10-CM | POA: Diagnosis not present

## 2020-09-12 DIAGNOSIS — F419 Anxiety disorder, unspecified: Secondary | ICD-10-CM | POA: Diagnosis not present

## 2020-09-12 DIAGNOSIS — F32A Depression, unspecified: Secondary | ICD-10-CM | POA: Diagnosis not present

## 2020-09-12 DIAGNOSIS — B182 Chronic viral hepatitis C: Secondary | ICD-10-CM | POA: Diagnosis not present

## 2020-09-12 DIAGNOSIS — Z1339 Encounter for screening examination for other mental health and behavioral disorders: Secondary | ICD-10-CM | POA: Diagnosis not present

## 2020-09-27 DIAGNOSIS — Z79899 Other long term (current) drug therapy: Secondary | ICD-10-CM | POA: Diagnosis not present

## 2020-09-27 DIAGNOSIS — B1920 Unspecified viral hepatitis C without hepatic coma: Secondary | ICD-10-CM | POA: Diagnosis not present

## 2020-09-27 DIAGNOSIS — F331 Major depressive disorder, recurrent, moderate: Secondary | ICD-10-CM | POA: Diagnosis not present

## 2020-09-27 DIAGNOSIS — F411 Generalized anxiety disorder: Secondary | ICD-10-CM | POA: Diagnosis not present

## 2020-09-27 DIAGNOSIS — F1121 Opioid dependence, in remission: Secondary | ICD-10-CM | POA: Diagnosis not present

## 2020-10-17 DIAGNOSIS — F419 Anxiety disorder, unspecified: Secondary | ICD-10-CM | POA: Diagnosis not present

## 2020-10-17 DIAGNOSIS — F32A Depression, unspecified: Secondary | ICD-10-CM | POA: Diagnosis not present

## 2020-10-17 DIAGNOSIS — B182 Chronic viral hepatitis C: Secondary | ICD-10-CM | POA: Diagnosis not present

## 2020-11-09 DIAGNOSIS — F1121 Opioid dependence, in remission: Secondary | ICD-10-CM | POA: Diagnosis not present

## 2020-11-09 DIAGNOSIS — F331 Major depressive disorder, recurrent, moderate: Secondary | ICD-10-CM | POA: Diagnosis not present

## 2020-11-09 DIAGNOSIS — F411 Generalized anxiety disorder: Secondary | ICD-10-CM | POA: Diagnosis not present

## 2020-11-09 DIAGNOSIS — Z79899 Other long term (current) drug therapy: Secondary | ICD-10-CM | POA: Diagnosis not present

## 2020-11-30 DIAGNOSIS — F411 Generalized anxiety disorder: Secondary | ICD-10-CM | POA: Diagnosis not present

## 2020-11-30 DIAGNOSIS — B1920 Unspecified viral hepatitis C without hepatic coma: Secondary | ICD-10-CM | POA: Diagnosis not present

## 2020-11-30 DIAGNOSIS — F331 Major depressive disorder, recurrent, moderate: Secondary | ICD-10-CM | POA: Diagnosis not present

## 2020-11-30 DIAGNOSIS — F1120 Opioid dependence, uncomplicated: Secondary | ICD-10-CM | POA: Diagnosis not present

## 2020-12-14 DIAGNOSIS — F1120 Opioid dependence, uncomplicated: Secondary | ICD-10-CM | POA: Diagnosis not present

## 2020-12-14 DIAGNOSIS — F331 Major depressive disorder, recurrent, moderate: Secondary | ICD-10-CM | POA: Diagnosis not present

## 2020-12-14 DIAGNOSIS — F411 Generalized anxiety disorder: Secondary | ICD-10-CM | POA: Diagnosis not present

## 2020-12-14 DIAGNOSIS — B1920 Unspecified viral hepatitis C without hepatic coma: Secondary | ICD-10-CM | POA: Diagnosis not present

## 2021-01-07 DIAGNOSIS — F411 Generalized anxiety disorder: Secondary | ICD-10-CM | POA: Diagnosis not present

## 2021-01-07 DIAGNOSIS — B1920 Unspecified viral hepatitis C without hepatic coma: Secondary | ICD-10-CM | POA: Diagnosis not present

## 2021-01-07 DIAGNOSIS — F1120 Opioid dependence, uncomplicated: Secondary | ICD-10-CM | POA: Diagnosis not present

## 2021-01-07 DIAGNOSIS — Z79899 Other long term (current) drug therapy: Secondary | ICD-10-CM | POA: Diagnosis not present

## 2021-01-07 DIAGNOSIS — F331 Major depressive disorder, recurrent, moderate: Secondary | ICD-10-CM | POA: Diagnosis not present

## 2021-01-16 DIAGNOSIS — B1920 Unspecified viral hepatitis C without hepatic coma: Secondary | ICD-10-CM | POA: Diagnosis not present

## 2021-01-24 DIAGNOSIS — F331 Major depressive disorder, recurrent, moderate: Secondary | ICD-10-CM | POA: Diagnosis not present

## 2021-01-24 DIAGNOSIS — B1920 Unspecified viral hepatitis C without hepatic coma: Secondary | ICD-10-CM | POA: Diagnosis not present

## 2021-01-24 DIAGNOSIS — F1120 Opioid dependence, uncomplicated: Secondary | ICD-10-CM | POA: Diagnosis not present

## 2021-01-24 DIAGNOSIS — F411 Generalized anxiety disorder: Secondary | ICD-10-CM | POA: Diagnosis not present

## 2021-02-14 DIAGNOSIS — Z79899 Other long term (current) drug therapy: Secondary | ICD-10-CM | POA: Diagnosis not present

## 2021-02-14 DIAGNOSIS — B1920 Unspecified viral hepatitis C without hepatic coma: Secondary | ICD-10-CM | POA: Diagnosis not present

## 2021-02-14 DIAGNOSIS — F1120 Opioid dependence, uncomplicated: Secondary | ICD-10-CM | POA: Diagnosis not present

## 2021-02-14 DIAGNOSIS — F411 Generalized anxiety disorder: Secondary | ICD-10-CM | POA: Diagnosis not present

## 2021-02-14 DIAGNOSIS — F331 Major depressive disorder, recurrent, moderate: Secondary | ICD-10-CM | POA: Diagnosis not present

## 2021-02-27 DIAGNOSIS — J101 Influenza due to other identified influenza virus with other respiratory manifestations: Secondary | ICD-10-CM | POA: Diagnosis not present

## 2021-03-14 DIAGNOSIS — F331 Major depressive disorder, recurrent, moderate: Secondary | ICD-10-CM | POA: Diagnosis not present

## 2021-03-14 DIAGNOSIS — F1120 Opioid dependence, uncomplicated: Secondary | ICD-10-CM | POA: Diagnosis not present

## 2021-03-14 DIAGNOSIS — B1920 Unspecified viral hepatitis C without hepatic coma: Secondary | ICD-10-CM | POA: Diagnosis not present

## 2021-03-14 DIAGNOSIS — F411 Generalized anxiety disorder: Secondary | ICD-10-CM | POA: Diagnosis not present

## 2021-03-27 DIAGNOSIS — F1511 Other stimulant abuse, in remission: Secondary | ICD-10-CM | POA: Diagnosis not present

## 2021-03-27 DIAGNOSIS — B182 Chronic viral hepatitis C: Secondary | ICD-10-CM | POA: Diagnosis not present

## 2021-03-27 DIAGNOSIS — F1911 Other psychoactive substance abuse, in remission: Secondary | ICD-10-CM | POA: Diagnosis not present

## 2021-03-27 DIAGNOSIS — F1111 Opioid abuse, in remission: Secondary | ICD-10-CM | POA: Diagnosis not present

## 2021-03-27 DIAGNOSIS — B1920 Unspecified viral hepatitis C without hepatic coma: Secondary | ICD-10-CM | POA: Diagnosis not present

## 2021-04-02 DIAGNOSIS — F419 Anxiety disorder, unspecified: Secondary | ICD-10-CM | POA: Diagnosis not present

## 2021-04-02 DIAGNOSIS — Z6823 Body mass index (BMI) 23.0-23.9, adult: Secondary | ICD-10-CM | POA: Diagnosis not present

## 2021-04-02 DIAGNOSIS — F32A Depression, unspecified: Secondary | ICD-10-CM | POA: Diagnosis not present

## 2022-02-20 DIAGNOSIS — R300 Dysuria: Secondary | ICD-10-CM | POA: Diagnosis not present

## 2022-02-20 DIAGNOSIS — N760 Acute vaginitis: Secondary | ICD-10-CM | POA: Diagnosis not present

## 2022-02-20 DIAGNOSIS — N898 Other specified noninflammatory disorders of vagina: Secondary | ICD-10-CM | POA: Diagnosis not present

## 2022-02-20 NOTE — Progress Notes (Addendum)
 Patient Name:  Heather Carney Date Of Birth:  November 19, 1995 Medical Record Number:  [id number] Date:  02/20/2022 Time: 7:55 PM Heather Carney is a 26 y.o. female.    CHIEF COMPLAINT   Vaginal Discharge  ASSESSMENT AND PLAN   Assessment/Plan Johnna was seen today for vaginal discharge. Diagnoses and all orders for this visit: 1. Acute vaginitis (Primary) -     Vaginitis and GC Panel 2. Dysuria -     POCT Urinalysis Dipstick -     Urine culture 3. Vaginal lesion -     Herpes Simplex Virus Culture Other orders -     valACYclovir (VALTREX) 1 gram tablet; Take 1 tablet (1,000 mg total) by mouth in the morning and 1 tablet (1,000 mg total) before bedtime. Do all this for 10 days.  Dispense: 20 tablet; Refill: 0 -     fluconazole  (Diflucan ) 150 mg tablet; Take 1 tablet by mouth.  Repeat in 72 hours as needed  Dispense: 2 tablet; Refill: 0  MDM Number of Diagnoses or Management Options Diagnosis management comments: Will start patient on empiric treatment for genital herpes and yeast infection.  She wishes to defer chlamydia and gonorrhea treatment while tests are pending.  Urine culture is collected for rule out of UTI.  She has no systemic findings.  Recommend avoiding sexual contact for at least 7-10 days and wait for test results.  Follow-up with PCP or OBGYN within 1 week if not improving.The patient is nontoxic and in no distress with no red flag features.  She is to go to the ER for any new, changing, worsening symptoms or for any concerns.  I discussed this visit with her in detail and answered any questions.  She agrees with the plan and is comfortable going home.     Amount and/or Complexity of Data Reviewed Clinical lab tests: ordered and reviewed  Risk of Complications, Morbidity, and/or Mortality Presenting problems: low Management options: moderate   HISTORY OF PRESENT ILLNESS   Subjective  This is a 26 year old female, sexually active, presents with dysuria, vaginal  discharge and vaginal sores over the past week.  She reports only 1 partner and denies known STI exposure.  She denies urinary urgency or frequency, fever or chills, lymphadenopathy, abdominal or flank pain or hematuria, nausea or vomiting.   There is no problem list on file for this patient.  Allergies  Allergen Reactions  . Latex Itching and Rash   Patient's last menstrual period was 02/12/2022 (approximate).  Current Outpatient Medications:  .  fluconazole  (Diflucan ) 150 mg tablet, Take 1 tablet by mouth.  Repeat in 72 hours as needed, Disp: 2 tablet, Rfl: 0 .  rOPINIRole  (REQUIP ) 1 mg tablet, Take 1 mg by mouth nightly. (Patient not taking: Reported on 02/20/2022), Disp: , Rfl:  .  valACYclovir (VALTREX) 1 gram tablet, Take 1 tablet (1,000 mg total) by mouth in the morning and 1 tablet (1,000 mg total) before bedtime. Do all this for 10 days., Disp: 20 tablet, Rfl: 0   HISTORIES REVIEWED  The following sections of the medical record have been reviewed, and updated as appropriate, during this encounter.  Tobacco  Allergies  Meds  Problems  Med Hx  Surg Hx  Fam Hx      REVIEW OF SYSTEMS   Review of Systems  Constitutional:  Negative for chills and fever.  Respiratory:  Negative for shortness of breath.   Cardiovascular:  Negative for chest pain.  Gastrointestinal:  Negative for nausea and vomiting.  Genitourinary:  Positive for dysuria, genital sores and vaginal discharge. Negative for frequency, hematuria and urgency.  Skin:  Negative for color change and wound.  Neurological:  Negative for dizziness, syncope, weakness, numbness and headaches.   Objective Vitals:   02/20/22 1914  BP: 109/74  BP Location: Left arm  Patient Position: Sitting  Pulse: (!) 114  Resp: 16  Temp: 36.7 C (98.1 F)  TempSrc: Oral  SpO2: 97%  Weight: 129 lb (58.5 kg)  Height: 5' 5 (1.651 m)   BP Readings from Last 3 Encounters:  02/20/22 109/74  12/04/18 98/64   Body mass index is  21.47 kg/m. PHYSICAL EXAMINATION   Physical Exam Vitals reviewed. Exam conducted with a chaperone present [facility]).  Constitutional:      General: She is not in acute distress.    Appearance: She is well-developed. She is not toxic-appearing.  HENT:     Head: Normocephalic and atraumatic.     Mouth/Throat:     Mouth: Mucous membranes are moist.  Eyes:     General: No scleral icterus. Cardiovascular:     Rate and Rhythm: Regular rhythm. Tachycardia present.  Pulmonary:     Effort: Pulmonary effort is normal.     Breath sounds: Normal breath sounds.  Abdominal:     General: There is no distension.     Palpations: Abdomen is soft. There is no mass.     Tenderness: There is no abdominal tenderness. There is no guarding.  Genitourinary:    Labia:        Right: Tenderness and lesion (Small ulcerative lesions on the inferior aspect of right labia) present.        Left: No tenderness.      Vagina: No signs of injury and foreign body. Vaginal discharge and tenderness present. No bleeding.     Cervix: Discharge (Copious thick whitish yellow) present. No cervical motion tenderness or erythema.     Adnexa:        Right: No tenderness or fullness.         Left: No tenderness or fullness.    Skin:    General: Skin is warm and dry.     Findings: No bruising, ecchymosis or erythema.  Neurological:     Mental Status: She is alert.    Results for orders placed or performed in visit on 02/20/22  POCT Urinalysis Dipstick  Result Value Ref Range   Color, urine Yellow Colorless, Light Yellow, Yellow, Straw   Clarity, urine Cloudy (A) Clear   Glucose, urine Negative Negative mg/dl   Bilirubin, urine Negative Negative   Ketones, urine Negative Negative   Spec Grav, urine 1.025 1.001 - 1.035   Blood, urine Negative Negative   pH, urine 7.0 5.0 - 8.0   Protein, urine Negative Negative   Urobilinogen, urine 0.2 Normal, 0.2, 1.0, >12.0, 2.0-3.0, 4.0-6.0, 8.0-12.0 mg/dl   Nitrite,  urine Negative Negative   WBC, urine Positive (A) Negative   Instrument Serial Number [id number]    Kit Lot Number [id number]    Kit Lot Expiration Date 08/05/2022     Results discussed with patient and/or family member(s).   FOLLOW UP   Return if symptoms worsen or fail to improve. [place] ONEIDA Batty, PAC

## 2022-02-24 NOTE — Progress Notes (Signed)
 Lab call back template mailed to address provided requesting return call.

## 2022-02-26 NOTE — Progress Notes (Signed)
 Lab call back template mailed to address provided requesting return call; unable to reach via telephone.

## 2023-11-30 NOTE — ED Triage Notes (Signed)
 Presents with 1 1/2 inch laceration to forehead.  Pt tripped over dog leash striking head on tool box.  (-)LOC

## 2023-12-27 ENCOUNTER — Other Ambulatory Visit: Payer: Self-pay

## 2023-12-27 ENCOUNTER — Emergency Department (HOSPITAL_COMMUNITY)
Admission: EM | Admit: 2023-12-27 | Discharge: 2023-12-27 | Discharge status: Against Medical Advice | Attending: Emergency Medicine | Admitting: Emergency Medicine

## 2023-12-27 DIAGNOSIS — Z91040 Latex allergy status: Secondary | ICD-10-CM | POA: Diagnosis not present

## 2023-12-27 DIAGNOSIS — H5711 Ocular pain, right eye: Secondary | ICD-10-CM | POA: Diagnosis not present

## 2023-12-27 DIAGNOSIS — R220 Localized swelling, mass and lump, head: Secondary | ICD-10-CM | POA: Diagnosis present

## 2023-12-27 DIAGNOSIS — Z5329 Procedure and treatment not carried out because of patient's decision for other reasons: Secondary | ICD-10-CM | POA: Insufficient documentation

## 2023-12-27 LAB — URINALYSIS, ROUTINE W REFLEX MICROSCOPIC
Bilirubin Urine: NEGATIVE
Glucose, UA: NEGATIVE mg/dL
Hgb urine dipstick: NEGATIVE
Ketones, ur: NEGATIVE mg/dL
Nitrite: NEGATIVE
Protein, ur: NEGATIVE mg/dL
Specific Gravity, Urine: 1.025 (ref 1.005–1.030)
pH: 6 (ref 5.0–8.0)

## 2023-12-27 NOTE — ED Notes (Signed)
 EDP notified of patient leaving AMA.

## 2023-12-27 NOTE — ED Triage Notes (Signed)
 Pt came in for right eye pain/swelling that started today. Pt stated it's painful and she does not know why it started swelling. Pt also has stiches on her forehead and has not turned black and has hardened. Pt stated she got her stiches 3 weeks ago and it has not been healing properly.

## 2023-12-27 NOTE — ED Notes (Signed)
 Staff called pt for an update with no answer.

## 2023-12-27 NOTE — ED Notes (Signed)
 Pt left out stating I'm going to my car really quick.

## 2023-12-27 NOTE — ED Notes (Signed)
 Delay in lab work and IV access due to patient being a difficult stick.

## 2023-12-27 NOTE — ED Provider Notes (Signed)
 [facility] EMERGENC[facility] DEPARTMENT AT [facility] Provider Note   CSN: [id number] Arrival date & time: 12/27/23  8283     Patient presents with: Facial Swelling and Wound Infection   Heather Carney is a 28 y.o. female patient who presents to the emergency department today for further evaluation of some orbital swelling that started about a week ago.  She is also complaining of some random bruising without trauma that started about a month ago.  She states that they are gone now.  She states that the swelling of her eye started within the last 1 to 2 days.  Hurts to move the eye as well.  She is also complaining of an old healing laceration to the frontal part of the head.  This was repaired at [facility] with dissolvable sutures.  I was able to read the record.  She still feels the sutures are still in there.  She denies any fever or chills.   HPI     Prior to Admission medications   Medication Sig Start Date End Date Taking? Authorizing Provider  buprenorphine (SUBUTEX) 8 MG SUBL SL tablet Place 8 mg under the tongue daily.    [provider]  famotidine  (PEPCID ) 20 MG tablet Take 20 mg by mouth 2 (two) times daily.    [provider]  Prenatal Vit-Fe Fumarate-FA (PRENATAL MULTIVITAMIN) TABS tablet Take 1 tablet by mouth daily at 12 noon.    [provider]  promethazine  (PHENERGAN ) 25 MG tablet Take 1 tablet (25 mg total) by mouth every 6 (six) hours as needed for nausea or vomiting. 10/20/19   Jerilynn Longs, NP    Allergies: Latex    Review of Systems  All other systems reviewed and are negative.   Updated Vital Signs BP (!) 135/94 (BP Location: Left Arm)   Pulse 95   Temp 98.1 F (36.7 C) (Oral)   Resp 16   SpO2 99%   Physical Exam Vitals and nursing note reviewed.  Constitutional:      General: She is not in acute distress.    Appearance: Normal appearance.  HENT:     Head: Normocephalic and atraumatic.   Eyes:     General:         Right eye: No discharge.        Left eye: No discharge.     Comments: Swelling of the upper right lid.  Painful extraocular movements per patient on the right.  No proptosis or chemosis.  Cardiovascular:     Comments: Regular rate and rhythm.  S1/S2 are distinct without any evidence of murmur, rubs, or gallops.  Radial pulses are 2+ bilaterally.  Dorsalis pedis pulses are 2+ bilaterally.  No evidence of pedal edema. Pulmonary:     Comments: Clear to auscultation bilaterally.  Normal effort.  No respiratory distress.  No evidence of wheezes, rales, or rhonchi heard throughout. Abdominal:     General: Abdomen is flat. Bowel sounds are normal. There is no distension.     Tenderness: There is no abdominal tenderness. There is no guarding or rebound.  Musculoskeletal:        General: Normal range of motion.     Cervical back: Neck supple.  Skin:    General: Skin is warm and dry.     Findings: No rash.  Neurological:     General: No focal deficit present.     Mental Status: She is alert.  Psychiatric:        Mood  and Affect: Mood normal.        Behavior: Behavior normal.     (all labs ordered are listed, but only abnormal results are displayed) Labs Reviewed  URINALYSIS, ROUTINE W REFLEX MICROSCOPIC - Abnormal; Notable for the following components:      Result Value   APPearance HAZY (*)    Leukocytes,Ua TRACE (*)    Bacteria, UA RARE (*)    All other components within normal limits  CBC WITH DIFFERENTIAL/PLATELET  BASIC METABOLIC PANEL WITH GFR  PREGNANCY, URINE    EKG: None  Radiology: No results found.   Procedures   Medications Ordered in the ED - No data to display   Medical Decision Making Heather Carney is a 28 y.o. patient who presents to the emergency department today for further evaluation of multiple complaints.  With regards to the laceration over the scalp I did review the [facility] note and it was used with 4-0 Vicryl.  They have been in there for approximately  3 weeks.  Do not see any any overt infection but it does have a large scab.  Patient does have some scattered bruising which she would not show me.  Patient got the laceration from a fall.  She denies any trauma.  While in the room asking questions, significant other does answer some of the questions for her and patient does seem a little bit fidgety.  I was able to get the patient alone and ask if she felt safe at home which she states that she did.  I asked if there was anything that she will discuss in private that was confidential.  She said no.  She has no concerns for abuse at this time.  I was notified by nursing staff that the patient went out to her vehicle and never returned.  Patient eloped from the emergency department.  She did not receive any blood work or scans.  With regards to her eye, we will plan to get basic labs to look for any platelet abnormalities for these bruising and get a CT scan of the orbits.  Somewhat concern for early orbital cellulitis.  Amount and/or Complexity of Data Reviewed Labs: ordered. Radiology: ordered.    Final diagnoses:  None    ED Discharge Orders     None          Heather Carney, [place] 12/27/23 1859    Armenta Canning, MD 12/27/23 2128
